# Patient Record
Sex: Male | Born: 1970 | Race: White | Hispanic: No | Marital: Married
Health system: Midwestern US, Community
[De-identification: ages and names within clinical notes are randomized; demographics above are authoritative.]

## PROBLEM LIST (undated history)

## (undated) DIAGNOSIS — E785 Hyperlipidemia, unspecified: Secondary | ICD-10-CM

## (undated) DIAGNOSIS — J020 Streptococcal pharyngitis: Secondary | ICD-10-CM

## (undated) DIAGNOSIS — Z9289 Personal history of other medical treatment: Secondary | ICD-10-CM

## (undated) DIAGNOSIS — K219 Gastro-esophageal reflux disease without esophagitis: Secondary | ICD-10-CM

## (undated) DIAGNOSIS — J040 Acute laryngitis: Secondary | ICD-10-CM

## (undated) DIAGNOSIS — J039 Acute tonsillitis, unspecified: Secondary | ICD-10-CM

## (undated) DIAGNOSIS — I2510 Atherosclerotic heart disease of native coronary artery without angina pectoris: Secondary | ICD-10-CM

## (undated) DIAGNOSIS — I214 Non-ST elevation (NSTEMI) myocardial infarction: Secondary | ICD-10-CM

## (undated) DIAGNOSIS — I10 Essential (primary) hypertension: Secondary | ICD-10-CM

## (undated) HISTORY — DX: Non-ST elevation (NSTEMI) myocardial infarction: I21.4

## (undated) HISTORY — DX: Atherosclerotic heart disease of native coronary artery without angina pectoris: I25.10

## (undated) HISTORY — DX: Gastro-esophageal reflux disease without esophagitis: K21.9

## (undated) HISTORY — DX: Personal history of other medical treatment: Z92.89

## (undated) HISTORY — DX: Hyperlipidemia, unspecified: E78.5

## (undated) HISTORY — PX: CORONARY ANGIOPLASTY WITH STENT PLACEMENT: SHX49

## (undated) HISTORY — PX: WISDOM TOOTH EXTRACTION: SHX21

---

## 2004-02-04 ENCOUNTER — Encounter: Admission: RE | Admit: 2004-02-04 | Discharge: 2004-02-04 | Payer: Self-pay | Admitting: Orthopedic Surgery

## 2004-02-25 ENCOUNTER — Encounter: Admission: RE | Admit: 2004-02-25 | Discharge: 2004-02-25 | Payer: Self-pay | Admitting: Orthopedic Surgery

## 2007-03-24 ENCOUNTER — Emergency Department (HOSPITAL_COMMUNITY): Admission: EM | Admit: 2007-03-24 | Discharge: 2007-03-24 | Payer: Self-pay | Admitting: Emergency Medicine

## 2008-01-06 ENCOUNTER — Ambulatory Visit (HOSPITAL_COMMUNITY): Admission: RE | Admit: 2008-01-06 | Discharge: 2008-01-06 | Payer: Self-pay | Admitting: Family Medicine

## 2008-02-03 ENCOUNTER — Ambulatory Visit (HOSPITAL_COMMUNITY): Admission: RE | Admit: 2008-02-03 | Discharge: 2008-02-03 | Payer: Self-pay | Admitting: General Surgery

## 2008-10-15 ENCOUNTER — Ambulatory Visit (HOSPITAL_COMMUNITY): Admission: RE | Admit: 2008-10-15 | Discharge: 2008-10-15 | Payer: Self-pay | Admitting: Internal Medicine

## 2009-02-17 ENCOUNTER — Ambulatory Visit (HOSPITAL_COMMUNITY): Admission: RE | Admit: 2009-02-17 | Discharge: 2009-02-17 | Payer: Self-pay | Admitting: Family Medicine

## 2009-03-19 ENCOUNTER — Emergency Department (HOSPITAL_COMMUNITY): Admission: EM | Admit: 2009-03-19 | Discharge: 2009-03-19 | Payer: Self-pay | Admitting: Emergency Medicine

## 2010-07-30 ENCOUNTER — Encounter: Payer: Self-pay | Admitting: Family Medicine

## 2010-11-21 NOTE — H&P (Signed)
Rodney Baldwin, ANTRIM                 ACCOUNT NO.:  [id number]   MEDICAL RECORD NO.:  [id number]          PATIENT TYPE:  AMB   LOCATION:  DAY                           FACILITY:  [facility]   PHYSICIAN:  Dalia Heading, M.D.  DATE OF BIRTH:  [birth year]   DATE OF ADMISSION:  DATE OF DISCHARGE:  LH                              HISTORY & PHYSICAL   CHIEF COMPLAINT:  GERD.   HISTORY OF PRESENT ILLNESS:  The patient is a 40 year old white male who  is referred for an endoscopic evaluation.  He needs an EGD for  gastroesophageal reflux disease.  He has been having GERD and  intermittent dysphagia for many years.  Nexium has been only mildly  helpful.  It is worse with lying down.  No abdominal pain, weight loss,  nausea, diarrhea, constipation, melena, hematochezia are noted.  The  patient does like carbonated drinks and tobacco.   PAST MEDICAL HISTORY:  As noted above.   PAST SURGICAL HISTORY:  Wisdom teeth extraction.   CURRENT MEDICATIONS:  Nexium 1 tablet p.o. b.i.d.   ALLERGIES:  No known drug allergies.   REVIEW OF SYSTEMS:  The patient does smoke tobacco.  He denies any  significant alcohol use.   PHYSICAL EXAMINATION:  GENERAL:  The patient is a well developed, well  nourished white male in no acute distress.  LUNGS:  Clear to auscultation with equal breath sounds bilaterally.  HEART:  Regular rate and rhythm without S3, S4, or murmurs.  ABDOMEN:  Soft, nontender, and nondistended.  No hepatosplenomegaly or  masses are noted.  RECTAL:  Deferred to the procedure.   IMPRESSION:  Gastroesophageal reflux disease and dysphagia.   PLAN:  The patient is scheduled for an EGD on February 03, 2008.  The risks  and benefits of the procedure including bleeding and perforation were  fully explained to the patient, gave informed consent.     Dalia Heading, M.D.  Electronically Signed    MAJ/MEDQ  D:  01/27/2008  T:  01/28/2008  Job:  [id number]

## 2011-02-09 ENCOUNTER — Emergency Department (HOSPITAL_COMMUNITY)
Admission: EM | Admit: 2011-02-09 | Discharge: 2011-02-09 | Disposition: A | Discharge status: Home / Self Care | Payer: PRIVATE HEALTH INSURANCE | Attending: Emergency Medicine | Admitting: Emergency Medicine

## 2011-02-09 ENCOUNTER — Encounter: Payer: Self-pay | Admitting: *Deleted

## 2011-02-09 DIAGNOSIS — I10 Essential (primary) hypertension: Secondary | ICD-10-CM | POA: Insufficient documentation

## 2011-02-09 DIAGNOSIS — K219 Gastro-esophageal reflux disease without esophagitis: Secondary | ICD-10-CM | POA: Insufficient documentation

## 2011-02-09 DIAGNOSIS — N419 Inflammatory disease of prostate, unspecified: Secondary | ICD-10-CM | POA: Insufficient documentation

## 2011-02-09 HISTORY — DX: Essential (primary) hypertension: I10

## 2011-02-09 LAB — URINALYSIS, ROUTINE W REFLEX MICROSCOPIC
Bilirubin Urine: NEGATIVE
Ketones, ur: NEGATIVE mg/dL
Protein, ur: NEGATIVE mg/dL
Urobilinogen, UA: 0.2 mg/dL (ref 0.0–1.0)
pH: 5.5 (ref 5.0–8.0)

## 2011-02-09 LAB — URINE MICROSCOPIC-ADD ON

## 2011-02-09 MED ORDER — DOXYCYCLINE HYCLATE 100 MG PO CAPS: 100.0000 mg | ORAL_CAPSULE | Freq: Two times a day (BID) | ORAL | Status: DC

## 2011-02-09 MED ORDER — CEFTRIAXONE SODIUM 250 MG IJ SOLR
250.0000 mg | Freq: Once | INTRAMUSCULAR | Status: AC
  Administered 2011-02-09: 250 mg via INTRAMUSCULAR
  Filled 2011-02-09: qty 250

## 2011-02-09 NOTE — ED Notes (Signed)
Urinary frequency x 1 wk.  Denies fever/n/v/abd pain.  Denies painful urination.

## 2011-02-09 NOTE — ED Provider Notes (Signed)
History   Chart scribed for Flint Melter, MD by Enos Fling; the patient was seen in room APFT20/APFT20; this patient's care was started at 12:53 PM.    CSN: [id number] Arrival date & time: 02/09/2011 11:33 AM  Chief Complaint  Patient presents with  . Urinary Frequency   HPI Rodney Baldwin is a 40 y.o. male who presents to the Emergency Department complaining of urinary frequency. Pt reports 4-5 days of increasing urinary frequency and dribbling, worse at night, reports has to urinate approx every 30 minutes. No dysuria, polydipsia, n/v/d, f/c, abd pain, or flank pain. Denies any pain at all. Reports similar episode 1x previously that resolved on its own with drinking more fluids. Pt is sexually active with 1 partner (wife).  PCP Dr. Phillips Odor   PAST MEDICAL HISTORY:  Past Medical History  Diagnosis Date  . Hypertension   . Acid reflux     PAST SURGICAL HISTORY:  History reviewed. No pertinent past surgical history.  MEDICATIONS:  Previous Medications   ESOMEPRAZOLE MAGNESIUM (NEXIUM PO)    Take by mouth.     VARENICLINE TARTRATE (CHANTIX PO)    Take by mouth.       ALLERGIES:  Allergies as of 02/09/2011  . (No Known Allergies)     FAMILY HISTORY: ** Reports strong Fhx DM and HTN. No family history on file.   SOCIAL HISTORY: History   Social History  . Marital Status: Married    Spouse Name: N/A    Number of Children: N/A  . Years of Education: N/A   Social History Main Topics  . Smoking status: Former Games developer  . Smokeless tobacco: None  . Alcohol Use: No  . Drug Use: No  . Sexually Active:    Other Topics Concern  . None   Social History Narrative  . None      Review of Systems  Constitutional: Negative for fever and chills.  Respiratory: Negative for cough.   Gastrointestinal: Negative for nausea, vomiting, abdominal pain and diarrhea.  Genitourinary: Positive for frequency. Negative for dysuria, flank pain, discharge, penile swelling, scrotal  swelling, penile pain and testicular pain.  Musculoskeletal: Negative for back pain.   Nursing notes and vital signs reviewed.  Physical Exam  BP 151/88  Pulse 64  Temp(Src) 97.5 F (36.4 C) (Oral)  Resp 20  Ht 6' (1.829 m)  Wt 204 lb (92.534 kg)  BMI 27.67 kg/m2  SpO2 99%  Physical Exam  Constitutional: He is oriented to person, place, and time. He appears well-developed and well-nourished.  HENT:  Head: Normocephalic and atraumatic.  Right Ear: External ear normal.  Left Ear: External ear normal.  Eyes: Conjunctivae and EOM are normal. Pupils are equal, round, and reactive to light.  Neck: Normal range of motion and phonation normal. Neck supple.  Cardiovascular: Normal rate, regular rhythm, normal heart sounds and intact distal pulses.   Pulmonary/Chest: Effort normal and breath sounds normal. He exhibits no bony tenderness.  Abdominal: Soft. Normal appearance. There is no tenderness. There is no CVA tenderness.  Genitourinary: Rectum normal and penis normal. No penile tenderness.       Prostate is mildly tender and swollen.  Musculoskeletal: Normal range of motion.       No back tenderness  Neurological: He is alert and oriented to person, place, and time. He has normal strength. No cranial nerve deficit or sensory deficit. He exhibits normal muscle tone. Coordination normal.  Skin: Skin is warm, dry and intact.  Psychiatric:  He has a normal mood and affect. His behavior is normal. Judgment and thought content normal.    ED Course  Procedures  OTHER DATA REVIEWED: Nursing notes and vital signs reviewed.   LABS / RADIOLOGY:  Results for orders placed during the hospital encounter of 02/09/11  URINALYSIS, ROUTINE W REFLEX MICROSCOPIC      Component Value Range   Color, Urine YELLOW  YELLOW    Appearance CLEAR  CLEAR    Specific Gravity, Urine 1.005  1.005 - 1.030    pH 5.5  5.0 - 8.0    Glucose, UA NEGATIVE  NEGATIVE (mg/dL)   Hgb urine dipstick SMALL (*)  NEGATIVE    Bilirubin Urine NEGATIVE  NEGATIVE    Ketones, ur NEGATIVE  NEGATIVE (mg/dL)   Protein, ur NEGATIVE  NEGATIVE (mg/dL)   Urobilinogen, UA 0.2  0.0 - 1.0 (mg/dL)   Nitrite NEGATIVE  NEGATIVE    Leukocytes, UA NEGATIVE  NEGATIVE   URINE MICROSCOPIC-ADD ON      Component Value Range   RBC / HPF 3-6  <3 (RBC/hpf)   Bacteria, UA RARE  RARE      MDM: Prostatitis; rocephin in ED; d/c with doxycycline   IMPRESSION Prostatitis      PLAN:  discharge The patient is to return the emergency department if there is any worsening of symptoms. I have reviewed the discharge instructions with the patient  CONDITION ON DISCHARGE: good   MEDICATIONS GIVEN IN THE E.D. cefTRIAXone (ROCEPHIN) injection 250 mg (250 mg Intramuscular Given 02/09/11 1259)     DISCHARGE MEDICATIONS: New Prescriptions   DOXYCYCLINE (VIBRAMYCIN) 100 MG CAPSULE    Take 1 capsule (100 mg total) by mouth 2 (two) times daily.     I personally performed the services described in this documentation, which was scribed in my presence. The recorded information has been reviewed and considered. Flint Melter, MD     Flint Melter, MD 02/09/11 [phone]

## 2011-02-09 NOTE — ED Notes (Signed)
Pt denies reaction from ABX shot. Pt a/ox4. Resp even and unlabored. NAD at this time. D/C instructions reviewed with pt. Pt verblaized understanding of instructions and RX. Pt escorted to d/c desk. Pt ambulated with steady gate.

## 2011-02-11 ENCOUNTER — Emergency Department (HOSPITAL_COMMUNITY): Payer: PRIVATE HEALTH INSURANCE

## 2011-02-11 ENCOUNTER — Encounter (HOSPITAL_COMMUNITY): Payer: Self-pay | Admitting: Emergency Medicine

## 2011-02-11 ENCOUNTER — Emergency Department (HOSPITAL_COMMUNITY)
Admission: EM | Admit: 2011-02-11 | Discharge: 2011-02-11 | Disposition: A | Discharge status: Home / Self Care | Payer: PRIVATE HEALTH INSURANCE | Attending: Emergency Medicine | Admitting: Emergency Medicine

## 2011-02-11 DIAGNOSIS — N200 Calculus of kidney: Secondary | ICD-10-CM | POA: Insufficient documentation

## 2011-02-11 DIAGNOSIS — Z87891 Personal history of nicotine dependence: Secondary | ICD-10-CM | POA: Insufficient documentation

## 2011-02-11 DIAGNOSIS — I10 Essential (primary) hypertension: Secondary | ICD-10-CM | POA: Insufficient documentation

## 2011-02-11 DIAGNOSIS — K219 Gastro-esophageal reflux disease without esophagitis: Secondary | ICD-10-CM | POA: Insufficient documentation

## 2011-02-11 LAB — CBC
HCT: 41.1 % (ref 39.0–52.0)
Hemoglobin: 14.4 g/dL (ref 13.0–17.0)
MCH: 30 pg (ref 26.0–34.0)
MCHC: 35 g/dL (ref 30.0–36.0)
MCV: 85.6 fL (ref 78.0–100.0)
Platelets: 190 K/uL (ref 150–400)
RBC: 4.8 MIL/uL (ref 4.22–5.81)
RDW: 13.1 % (ref 11.5–15.5)
WBC: 12.4 K/uL — ABNORMAL HIGH (ref 4.0–10.5)

## 2011-02-11 LAB — URINALYSIS, ROUTINE W REFLEX MICROSCOPIC
Bilirubin Urine: NEGATIVE
Glucose, UA: NEGATIVE mg/dL
Ketones, ur: NEGATIVE mg/dL
Leukocytes, UA: NEGATIVE
Nitrite: NEGATIVE
Protein, ur: 30 mg/dL — AB
Specific Gravity, Urine: >1.03 — ABNORMAL HIGH (ref 1.005–1.030)
Urobilinogen, UA: 0.2 mg/dL (ref 0.0–1.0)
pH: 5.5 (ref 5.0–8.0)

## 2011-02-11 LAB — COMPREHENSIVE METABOLIC PANEL
AST: 19 U/L (ref 0–37)
Albumin: 4.6 g/dL (ref 3.5–5.2)
Alkaline Phosphatase: 92 U/L (ref 39–117)
BUN: 19 mg/dL (ref 6–23)
CO2: 21 mEq/L (ref 19–32)
Calcium: 9.8 mg/dL (ref 8.4–10.5)
Chloride: 101 mEq/L (ref 96–112)
GFR calc Af Amer: >60 mL/min (ref >60)
Sodium: 136 mEq/L (ref 135–145)
Total Bilirubin: 0.4 mg/dL (ref 0.3–1.2)
Total Protein: 8.4 g/dL — ABNORMAL HIGH (ref 6.0–8.3)

## 2011-02-11 LAB — DIFFERENTIAL
Basophils Absolute: 0 K/uL (ref 0.0–0.1)
Basophils Relative: 0 % (ref 0–1)
Eosinophils Absolute: 0.2 K/uL (ref 0.0–0.7)
Eosinophils Relative: 2 % (ref 0–5)
Lymphocytes Relative: 21 % (ref 12–46)
Lymphs Abs: 2.6 K/uL (ref 0.7–4.0)
Monocytes Absolute: 0.8 K/uL (ref 0.1–1.0)
Monocytes Relative: 7 % (ref 3–12)
Neutro Abs: 8.8 K/uL — ABNORMAL HIGH (ref 1.7–7.7)
Neutrophils Relative %: 71 % (ref 43–77)

## 2011-02-11 LAB — LIPASE, BLOOD: Lipase: 35 U/L (ref 11–59)

## 2011-02-11 LAB — URINE MICROSCOPIC-ADD ON

## 2011-02-11 MED ORDER — ONDANSETRON HCL 4 MG/2ML IJ SOLN
4.0000 mg | Freq: Once | INTRAMUSCULAR | Status: AC
  Administered 2011-02-11: 4 mg via INTRAVENOUS
  Filled 2011-02-11: qty 2

## 2011-02-11 MED ORDER — IOHEXOL 300 MG/ML  SOLN
100.0000 mL | Freq: Once | INTRAMUSCULAR | Status: AC | PRN
  Administered 2011-02-11: 100 mL via INTRAVENOUS

## 2011-02-11 MED ORDER — IBUPROFEN 800 MG PO TABS: 800.0000 mg | ORAL_TABLET | Freq: Three times a day (TID) | ORAL | Status: AC

## 2011-02-11 MED ORDER — HYDROMORPHONE HCL 1 MG/ML IJ SOLN
INTRAMUSCULAR | Status: AC
  Filled 2011-02-11: qty 1

## 2011-02-11 MED ORDER — TAMSULOSIN HCL 0.4 MG PO CAPS: 0.4000 mg | ORAL_CAPSULE | Freq: Every day | ORAL | Status: DC

## 2011-02-11 MED ORDER — HYDROMORPHONE HCL 1 MG/ML IJ SOLN
1.0000 mg | Freq: Once | INTRAMUSCULAR | Status: AC
Start: 2011-02-11 — End: 2011-02-11
  Administered 2011-02-11: 1 mg via INTRAVENOUS
  Filled 2011-02-11: qty 1

## 2011-02-11 MED ORDER — HYDROMORPHONE HCL 1 MG/ML IJ SOLN
1.0000 mg | Freq: Once | INTRAMUSCULAR | Status: AC
  Administered 2011-02-11: 1 mg via INTRAVENOUS

## 2011-02-11 MED ORDER — OXYCODONE-ACETAMINOPHEN 5-325 MG PO TABS
1.0000 | ORAL_TABLET | Freq: Four times a day (QID) | ORAL | Status: AC | PRN
Start: 2011-02-11 — End: 2011-02-21

## 2011-02-11 NOTE — ED Notes (Signed)
Pt c/o continued lower abd and groin pain with new onset n/v. Pt states he was seen in ed on Friday and dx with prostatitis.

## 2011-02-11 NOTE — ED Provider Notes (Signed)
Scribed for [employer]. Bernette Mayers, MD, the patient was seen in room 15. This chart was scribed by Jannette Fogo. This patient's care was started at 08:26.    CSN: [id number] Arrival date & time: 02/11/2011  8:04 AM  Chief Complaint  Patient presents with  . Abdominal Pain   HPI Rodney Baldwin is a 40 y.o. male who presents to the Emergency Department complaining of right lower quadrant abdominal pain with associated nausea and vomiting. Patient was recently seen in the ED on 02/09/11 by Dr. Effie Shy for urinary frequency and diagnosed with prostatitis. He was given Rocephin in the ED and discharged home with Doxycycline. Patient returns to the ED complaining of new onset of right lower quadrant abdominal pain which started this AM. He reports associated vomiting, difficulty urinating with dysuria and frequency, and frequent bowel movements. He denies any fever or hematochezia. Denies a history of appendectomy. There are no other associated symptoms and no other alleviating or aggravating factors.     Past Medical History  Diagnosis Date  . Hypertension   . Acid reflux     MEDICATIONS:  Previous Medications   DOXYCYCLINE (VIBRAMYCIN) 100 MG CAPSULE    Take 1 capsule (100 mg total) by mouth 2 (two) times daily.   ESOMEPRAZOLE (NEXIUM) 40 MG CAPSULE    Take 40 mg by mouth 2 (two) times daily.     ESOMEPRAZOLE MAGNESIUM (NEXIUM PO)    Take 40 mg by mouth 2 (two) times daily.    IBUPROFEN (ADVIL,MOTRIN) 200 MG TABLET    Take 800 mg by mouth every 8 (eight) hours as needed. OTC FOR PAIN OR headache   VARENICLINE (CHANTIX) 1 MG TABLET    Take 1 mg by mouth 2 (two) times daily.     VARENICLINE TARTRATE (CHANTIX PO)    Take 1 mg by mouth 2 (two) times daily. Patient is on his  Last month     ALLERGIES:  Allergies as of 02/11/2011  . (No Known Allergies)      History  Substance Use Topics  . Smoking status: Former Games developer  . Smokeless tobacco: Not on file  . Alcohol Use: No  Married  Accompanied  to the ED by wife Quit smoking tobacco    Review of Systems  Constitutional: Negative.  Negative for fever.  HENT: Negative.   Respiratory: Negative.   Cardiovascular: Negative.   Gastrointestinal: Positive for nausea, vomiting and abdominal pain. Negative for constipation and blood in stool.  Genitourinary: Positive for dysuria, urgency, frequency and difficulty urinating.  Musculoskeletal: Negative.   Skin: Negative.   Neurological: Negative.   Hematological: Negative.   All other systems reviewed and are negative.    Physical Exam  BP 123/88  Pulse 60  Temp(Src) 97.7 F (36.5 C) (Oral)  Resp 20  Ht 6' (1.829 m)  Wt 204 lb (92.534 kg)  BMI 27.67 kg/m2  SpO2 96%  Physical Exam  Nursing note and vitals reviewed. Constitutional: He is oriented to person, place, and time. He appears well-developed and well-nourished.  HENT:  Head: Normocephalic and atraumatic.  Eyes: EOM are normal. Pupils are equal, round, and reactive to light.  Neck: Normal range of motion. Neck supple.  Cardiovascular: Normal rate, normal heart sounds and intact distal pulses.   Pulmonary/Chest: Effort normal and breath sounds normal.  Abdominal: Bowel sounds are normal. He exhibits no distension. There is tenderness (RLQ). There is guarding (RLQ).  Musculoskeletal: Normal range of motion. He exhibits no edema and no tenderness.  Neurological: He is alert and oriented to person, place, and time. No cranial nerve deficit.  Skin: Skin is warm and dry. No rash noted.  Psychiatric: He has a normal mood and affect.    OTHER DATA REVIEWED: Nursing notes, vital signs, and past medical records reviewed.   DIAGNOSTIC STUDIES: Oxygen Saturation is 96% on room air, normal by my interpretation.     LABS / RADIOLOGY:  Results for orders placed during the hospital encounter of 02/11/11  CBC      Component Value Range   WBC 12.4 (*) 4.0 - 10.5 (K/uL)   RBC 4.80  4.22 - 5.81 (MIL/uL)   Hemoglobin 14.4   13.0 - 17.0 (g/dL)   HCT 16.1  09.6 - 04.5 (%)   MCV 85.6  78.0 - 100.0 (fL)   MCH 30.0  26.0 - 34.0 (pg)   MCHC 35.0  30.0 - 36.0 (g/dL)   RDW 40.9  81.1 - 91.4 (%)   Platelets 190  150 - 400 (K/uL)  DIFFERENTIAL      Component Value Range   Neutrophils Relative 71  43 - 77 (%)   Neutro Abs 8.8 (*) 1.7 - 7.7 (K/uL)   Lymphocytes Relative 21  12 - 46 (%)   Lymphs Abs 2.6  0.7 - 4.0 (K/uL)   Monocytes Relative 7  3 - 12 (%)   Monocytes Absolute 0.8  0.1 - 1.0 (K/uL)   Eosinophils Relative 2  0 - 5 (%)   Eosinophils Absolute 0.2  0.0 - 0.7 (K/uL)   Basophils Relative 0  0 - 1 (%)   Basophils Absolute 0.0  0.0 - 0.1 (K/uL)  COMPREHENSIVE METABOLIC PANEL      Component Value Range   Sodium 136  135 - 145 (mEq/L)   Potassium 3.8  3.5 - 5.1 (mEq/L)   Chloride 101  96 - 112 (mEq/L)   CO2 21  19 - 32 (mEq/L)   Glucose, Bld 127 (*) 70 - 99 (mg/dL)   BUN 19  6 - 23 (mg/dL)   Creatinine, Ser 7.82  0.50 - 1.35 (mg/dL)   Calcium 9.8  8.4 - 95.6 (mg/dL)   Total Protein 8.4 (*) 6.0 - 8.3 (g/dL)   Albumin 4.6  3.5 - 5.2 (g/dL)   AST 19  0 - 37 (U/L)   ALT 26  0 - 53 (U/L)   Alkaline Phosphatase 92  39 - 117 (U/L)   Total Bilirubin 0.4  0.3 - 1.2 (mg/dL)   GFR calc non Af Amer >60  >60 (mL/min)   GFR calc Af Amer >60  >60 (mL/min)  LIPASE, BLOOD      Component Value Range   Lipase 35  11 - 59 (U/L)  URINALYSIS, ROUTINE W REFLEX MICROSCOPIC      Component Value Range   Color, Urine YELLOW  YELLOW    Appearance HAZY (*) CLEAR    Specific Gravity, Urine >1.030 (*) 1.005 - 1.030    pH 5.5  5.0 - 8.0    Glucose, UA NEGATIVE  NEGATIVE (mg/dL)   Hgb urine dipstick LARGE (*) NEGATIVE    Bilirubin Urine NEGATIVE  NEGATIVE    Ketones, ur NEGATIVE  NEGATIVE (mg/dL)   Protein, ur 30 (*) NEGATIVE (mg/dL)   Urobilinogen, UA 0.2  0.0 - 1.0 (mg/dL)   Nitrite NEGATIVE  NEGATIVE    Leukocytes, UA NEGATIVE  NEGATIVE   URINE MICROSCOPIC-ADD ON      Component Value Range   Squamous Epithelial /  LPF  RARE  RARE    WBC, UA 0-2  <3 (WBC/hpf)   RBC / HPF 11-20  <3 (RBC/hpf)   Bacteria, UA FEW (*) RARE    Crystals CA OXALATE CRYSTALS (*) NEGATIVE    Urine-Other MUCOUS PRESENT        CT Abdomen / Pelvis: Interpreted by Radiologist, reviewed by me  No appendicitis, large distal R ureteral stone    ED COURSE / COORDINATION OF CARE: Labs unremarkable except for hematuria. CT as above. Pt feeling better, ready to go home. D/C with pain meds, flomax and urology referral if needed.     IMPRESSION: Diagnoses that have been ruled out:  Diagnoses that are still under consideration:  Final diagnoses:    PLAN:   The patient is to return the emergency department if there is any worsening of symptoms. I have reviewed the discharge instructions with the patient.    CONDITION ON DISCHARGE: Good   MEDICATIONS GIVEN IN THE E.D.  Medications  varenicline (CHANTIX) 1 MG tablet (not administered)  HYDROmorphone (DILAUDID) injection 1 mg (1 mg Intravenous Given 02/11/11 0903)  ondansetron (ZOFRAN) injection 4 mg (4 mg Intravenous Given 02/11/11 0845)     DISCHARGE MEDICATIONS: New Prescriptions   No medications on file       Procedures  I personally performed the services described in this documentation, which was scribed in my presence. The recorded information has been reviewed and considered. ORLOVIC, MATEA       Roann Merk B. Bernette Mayers, MD 02/11/11 1133

## 2011-02-13 LAB — URINE CULTURE: Culture  Setup Time: 201208051950

## 2011-02-15 ENCOUNTER — Ambulatory Visit: Payer: Self-pay | Admitting: Urology

## 2011-03-06 ENCOUNTER — Ambulatory Visit: Payer: PRIVATE HEALTH INSURANCE | Admitting: Urology

## 2011-03-15 ENCOUNTER — Ambulatory Visit: Payer: Self-pay | Admitting: Urology

## 2011-03-22 ENCOUNTER — Ambulatory Visit: Payer: Self-pay | Admitting: Urology

## 2011-04-06 ENCOUNTER — Ambulatory Visit: Payer: Self-pay | Admitting: Urology

## 2011-04-06 LAB — CLOTEST (H. PYLORI), BIOPSY: Helicobacter screen: NEGATIVE

## 2011-04-19 LAB — DIFFERENTIAL
Eosinophils Absolute: 0.3
Lymphs Abs: 3.1
Monocytes Absolute: 0.6
Monocytes Relative: 4
Neutro Abs: 9.8 — ABNORMAL HIGH
Neutrophils Relative %: 71

## 2011-04-19 LAB — COMPREHENSIVE METABOLIC PANEL
ALT: 32
Albumin: 4.1
Calcium: 9
Glucose, Bld: 109 — ABNORMAL HIGH
Potassium: 3.7
Sodium: 135
Total Protein: 7.6

## 2011-04-19 LAB — CBC
Hemoglobin: 15
MCHC: 34.6
Platelets: 205
RDW: 12.8

## 2011-04-19 LAB — CARBOXYHEMOGLOBIN
Carboxyhemoglobin: 1.7 — ABNORMAL HIGH
O2 Saturation: 99.4

## 2012-04-08 ENCOUNTER — Emergency Department (HOSPITAL_COMMUNITY)
Admission: EM | Admit: 2012-04-08 | Discharge: 2012-04-09 | Disposition: A | Discharge status: Home / Self Care | Payer: PRIVATE HEALTH INSURANCE | Attending: Emergency Medicine | Admitting: Emergency Medicine

## 2012-04-08 ENCOUNTER — Encounter (HOSPITAL_COMMUNITY): Payer: Self-pay | Admitting: *Deleted

## 2012-04-08 DIAGNOSIS — R599 Enlarged lymph nodes, unspecified: Secondary | ICD-10-CM | POA: Insufficient documentation

## 2012-04-08 DIAGNOSIS — F17200 Nicotine dependence, unspecified, uncomplicated: Secondary | ICD-10-CM | POA: Insufficient documentation

## 2012-04-08 DIAGNOSIS — K219 Gastro-esophageal reflux disease without esophagitis: Secondary | ICD-10-CM | POA: Insufficient documentation

## 2012-04-08 DIAGNOSIS — I10 Essential (primary) hypertension: Secondary | ICD-10-CM | POA: Insufficient documentation

## 2012-04-08 DIAGNOSIS — R591 Generalized enlarged lymph nodes: Secondary | ICD-10-CM

## 2012-04-08 MED ORDER — OXYCODONE-ACETAMINOPHEN 5-325 MG PO TABS
2.0000 | ORAL_TABLET | Freq: Once | ORAL | Status: AC
  Administered 2012-04-09: 2 via ORAL
  Filled 2012-04-08: qty 2

## 2012-04-08 NOTE — ED Notes (Signed)
Pt reporting painful lump in groin area.  Reports felt lump last week, pain started on Sunday.  Denies difficulty with urination.

## 2012-04-08 NOTE — ED Notes (Signed)
Patient complaining of "lump in groin" x 1 week with pain starting this weekend. States pain is constant but worse with movement. Denies difficulty urinating or burning with urination.

## 2012-04-08 NOTE — ED Provider Notes (Signed)
History   This chart was scribed for Lyanne Co, MD by Gerlean Ren. This patient was seen in room APA10/APA10 and the patient's care was started at 11:31PM.   CSN: [id number]  Arrival date & time 04/08/12  2211   First MD Initiated Contact with Patient 04/08/12 2306      Chief Complaint  Patient presents with  . Groin Pain    Patient is a 41 y.o. male presenting with groin pain. The history is provided by the patient. No language interpreter was used.  Groin Pain   Rodney Baldwin is a 41 y.o. male who presents to the Emergency Department complaining of a mass in right groin area first noticed last week with associated gradual onset, throbbing  pain beginning 2 days ago.  Pt able to ambulate with minimal discomfort.  Pt has used ibuprofen with no improvements.  Pt has h/o HTN.  Pt is a current everyday smoker (0.5pack/day) but denies alcohol use.  He denies penile pain or scrotal pain.  His had no fevers or chills.  He denies nausea vomiting.  He's been moving his bowels normally.  He has no dysuria or urinary frequency.  He denies testicular pain.  His had no overlying skin changes.  Past Medical History  Diagnosis Date  . Hypertension   . Acid reflux     History reviewed. No pertinent past surgical history.  History reviewed. No pertinent family history.  History  Substance Use Topics  . Smoking status: Current Every Day Smoker -- 0.5 packs/day  . Smokeless tobacco: Not on file  . Alcohol Use: No      Review of Systems A complete 10 system review of systems was obtained and all systems are negative except as noted in the HPI and PMH.   Allergies  Review of patient's allergies indicates no known allergies.  Home Medications   Current Outpatient Rx  Name Route Sig Dispense Refill  . ESOMEPRAZOLE MAGNESIUM 40 MG PO CPDR Oral Take 40 mg by mouth 2 (two) times daily.      Marland Kitchen NEXIUM PO Oral Take 40 mg by mouth 2 (two) times daily.     . IBUPROFEN 200 MG PO TABS Oral Take  800 mg by mouth every 8 (eight) hours as needed. OTC FOR PAIN OR headache    . TAMSULOSIN HCL 0.4 MG PO CAPS Oral Take 1 capsule (0.4 mg total) by mouth daily. 14 capsule 0  . VARENICLINE TARTRATE 1 MG PO TABS Oral Take 1 mg by mouth 2 (two) times daily.      . CHANTIX PO Oral Take 1 mg by mouth 2 (two) times daily. Patient is on his  Last month      BP 145/85  Pulse 94  Temp 98.5 F (36.9 C) (Oral)  Resp 20  Ht 6' (1.829 m)  Wt 214 lb (97.07 kg)  BMI 29.02 kg/m2  SpO2 99%  Physical Exam  Nursing note and vitals reviewed. Constitutional: He is oriented to person, place, and time. He appears well-developed.  HENT:  Head: Normocephalic and atraumatic.  Eyes: EOM are normal.  Neck: No tracheal deviation present.  Genitourinary: Penis normal. No penile tenderness.       Small palpable and tender mass that is freely mobiel in right mid-inguinal area.  No overlying skin changes or erythema.   Circumcised penis, normal in appearance and non-tender.   Normal right inguinal canal.  No palpable hernia.    Musculoskeletal: Normal range of motion. He  exhibits no edema.  Neurological: He is alert and oriented to person, place, and time.  Skin: Skin is warm. No rash noted.  Psychiatric: He has a normal mood and affect.    ED Course  Procedures (including critical care time) DIAGNOSTIC STUDIES: Oxygen Saturation is 99% on room air, normal by my interpretation.    COORDINATION OF CARE: 11:36PM-  Ordered percocet.  Informed pt that immediate care is not necessary. Advised further at-home observation and follow-up with PCP if symptoms worsen.     Labs Reviewed - No data to display No results found.   1. Lymphadenopathy       MDM  This is well appearing.  I suspect this is lymphadenopathy in his right groin.  No signs of hernia at this time.  The patient we discharged home to follow up with his primary care physician.  No external signs of infection.  No recent trauma.  Abdomen is  benign.  The patient understands the importance of close PCP followup and continued vigilant monitoring of his right groin  I personally performed the services described in this documentation, which was scribed in my presence. The recorded information has been reviewed and considered.          Lyanne Co, MD 04/09/12 0010

## 2012-04-09 MED ORDER — OXYCODONE-ACETAMINOPHEN 5-325 MG PO TABS: 1.0000 | ORAL_TABLET | ORAL | Status: DC | PRN

## 2013-02-28 ENCOUNTER — Emergency Department (HOSPITAL_COMMUNITY)
Admission: EM | Admit: 2013-02-28 | Discharge: 2013-03-01 | Disposition: A | Discharge status: Home / Self Care | Payer: BC Managed Care – PPO | Attending: Emergency Medicine | Admitting: Emergency Medicine

## 2013-02-28 DIAGNOSIS — I10 Essential (primary) hypertension: Secondary | ICD-10-CM | POA: Insufficient documentation

## 2013-02-28 DIAGNOSIS — F17200 Nicotine dependence, unspecified, uncomplicated: Secondary | ICD-10-CM | POA: Insufficient documentation

## 2013-02-28 DIAGNOSIS — K219 Gastro-esophageal reflux disease without esophagitis: Secondary | ICD-10-CM | POA: Insufficient documentation

## 2013-02-28 DIAGNOSIS — K1379 Other lesions of oral mucosa: Secondary | ICD-10-CM

## 2013-02-28 DIAGNOSIS — Z79899 Other long term (current) drug therapy: Secondary | ICD-10-CM | POA: Insufficient documentation

## 2013-02-28 DIAGNOSIS — Z8619 Personal history of other infectious and parasitic diseases: Secondary | ICD-10-CM | POA: Insufficient documentation

## 2013-02-28 DIAGNOSIS — K1370 Unspecified lesions of oral mucosa: Secondary | ICD-10-CM | POA: Insufficient documentation

## 2013-02-28 DIAGNOSIS — R1110 Vomiting, unspecified: Secondary | ICD-10-CM | POA: Insufficient documentation

## 2013-02-28 DIAGNOSIS — Z8709 Personal history of other diseases of the respiratory system: Secondary | ICD-10-CM | POA: Insufficient documentation

## 2013-02-28 HISTORY — DX: Streptococcal pharyngitis: J02.0

## 2013-02-28 HISTORY — DX: Acute laryngitis: J04.0

## 2013-02-28 HISTORY — DX: Acute tonsillitis, unspecified: J03.90

## 2013-02-28 NOTE — ED Notes (Signed)
Patient states that his wife woke him up to go to bed and that his throat was hurting. States that he was walking down the hall in the house and it felt like his throat was closing.

## 2013-03-01 ENCOUNTER — Encounter (HOSPITAL_COMMUNITY): Payer: Self-pay

## 2013-03-01 ENCOUNTER — Emergency Department (HOSPITAL_COMMUNITY): Payer: BC Managed Care – PPO

## 2013-03-01 LAB — RAPID STREP SCREEN (MED CTR MEBANE ONLY): Streptococcus, Group A Screen (Direct): NEGATIVE

## 2013-03-01 MED ORDER — RACEPINEPHRINE HCL 2.25 % IN NEBU
0.5000 mL | INHALATION_SOLUTION | Freq: Once | RESPIRATORY_TRACT | Status: AC
  Administered 2013-03-01: 0.5 mL via RESPIRATORY_TRACT
  Filled 2013-03-01: qty 0.5

## 2013-03-01 MED ORDER — DIPHENHYDRAMINE HCL 50 MG/ML IJ SOLN
50.0000 mg | Freq: Once | INTRAMUSCULAR | Status: AC
  Administered 2013-03-01: 50 mg via INTRAVENOUS
  Filled 2013-03-01: qty 1

## 2013-03-01 MED ORDER — DEXAMETHASONE SODIUM PHOSPHATE 10 MG/ML IJ SOLN
10.0000 mg | Freq: Once | INTRAMUSCULAR | Status: AC
  Administered 2013-03-01: 10 mg via INTRAVENOUS
  Filled 2013-03-01: qty 1

## 2013-03-01 MED ORDER — DIPHENHYDRAMINE HCL 25 MG PO TABS: 50.0000 mg | ORAL_TABLET | Freq: Four times a day (QID) | ORAL | Status: DC | PRN

## 2013-03-01 MED ORDER — FAMOTIDINE 20 MG PO TABS: 20.0000 mg | ORAL_TABLET | Freq: Two times a day (BID) | ORAL | Status: DC | PRN

## 2013-03-01 MED ORDER — FAMOTIDINE IN NACL 20-0.9 MG/50ML-% IV SOLN
20.0000 mg | Freq: Once | INTRAVENOUS | Status: AC
  Administered 2013-03-01: 20 mg via INTRAVENOUS
  Filled 2013-03-01: qty 50

## 2013-03-01 MED ORDER — SODIUM CHLORIDE 0.9 % IV SOLN
INTRAVENOUS | Status: DC
  Administered 2013-03-01: via INTRAVENOUS

## 2013-03-01 NOTE — ED Provider Notes (Signed)
CSN: [id number]     Arrival date & time 02/28/13  2353 History     First MD Initiated Contact with Patient 03/01/13 0009     Chief Complaint  Patient presents with  . Throat Pain and Swelling    (Consider location/radiation/quality/duration/timing/severity/associated sxs/prior Treatment) HPI This is a 42 year old male from a nap about 45 minutes ago with the sensation that his uvula was swollen. It is causing him to gag and there is moderate pain when he swallows. There is no associated itching, rash, wheezing, fever or shortness of breath. He has vomited once as a result of gagging. He is not aware of anything that may have triggered this.  Past Medical History  Diagnosis Date  . Hypertension   . Acid reflux   . Strep throat   . Tonsillitis   . Laryngitis    History reviewed. No pertinent past surgical history. No family history on file. History  Substance Use Topics  . Smoking status: Current Every Day Smoker -- 0.50 packs/day  . Smokeless tobacco: Not on file  . Alcohol Use: No    Review of Systems  All other systems reviewed and are negative.    Allergies  Review of patient's allergies indicates no known allergies.  Home Medications   Current Outpatient Rx  Name  Route  Sig  Dispense  Refill  . amLODipine (NORVASC) 10 MG tablet   Oral   Take 10 mg by mouth daily.         Marland Kitchen esomeprazole (NEXIUM) 40 MG capsule   Oral   Take 40 mg by mouth 2 (two) times daily.           . Esomeprazole Magnesium (NEXIUM PO)   Oral   Take 40 mg by mouth 2 (two) times daily.          Marland Kitchen ibuprofen (ADVIL,MOTRIN) 200 MG tablet   Oral   Take 800 mg by mouth every 8 (eight) hours as needed. OTC FOR PAIN OR headache         . oxyCODONE-acetaminophen (PERCOCET/ROXICET) 5-325 MG per tablet   Oral   Take 1 tablet by mouth every 4 (four) hours as needed for pain.   20 tablet   0   . Tamsulosin HCl (FLOMAX) 0.4 MG CAPS   Oral   Take 1 capsule (0.4 mg total) by mouth  daily.   14 capsule   0   . varenicline (CHANTIX) 1 MG tablet   Oral   Take 1 mg by mouth 2 (two) times daily.           . Varenicline Tartrate (CHANTIX PO)   Oral   Take 1 mg by mouth 2 (two) times daily. Patient is on his  Last month          BP 130/76  Pulse 64  Temp(Src) 97.6 F (36.4 C) (Oral)  Resp 20  SpO2 97%  Physical Exam General: Well-developed, well-nourished male in no acute distress; appearance consistent with age of record HENT: normocephalic; atraumatic; edema of uvula without erythema or exudate; no dysphonia; no stridor Eyes: pupils equal, round and reactive to light; extraocular muscles intact Neck: supple Heart: regular rate and rhythm Lungs: clear to auscultation bilaterally Abdomen: soft; nondistended Extremities: No deformity; full range of motion Neurologic: Awake, alert and oriented; motor function intact in all extremities and symmetric; no facial droop Skin: Warm and dry Psychiatric: Anxious    ED Course   Procedures (including critical care time)   MDM  Nursing notes and vitals signs, including pulse oximetry, reviewed.  Summary of this visit's results, reviewed by myself:  Labs:  Results for orders placed during the hospital encounter of 02/28/13 (from the past 24 hour(s))  RAPID STREP SCREEN     Status: None   Collection Time    03/01/13 12:15 AM      Result Value Range   Streptococcus, Group A Screen (Direct) NEGATIVE  NEGATIVE    Imaging Studies: Dg Neck Soft Tissue  03/01/2013   *RADIOLOGY REPORT*  Clinical Data: The patient awoke with swelling of the uvula. Difficulty swallowing.  Sore throat.  NECK SOFT TISSUES - 1+ VIEW  Comparison: CT head 03/24/2007  Findings: No significant prevertebral or submental soft tissue swelling.  No radiopaque foreign bodies.  Epiglottis and aryepiglottic folds are not thickened.  Normal alignment of the cervical spine.  IMPRESSION: No discrete soft tissue swelling or radiopaque foreign  bodies demonstrated in the neck.   Original Report Authenticated By: Burman Nieves, M.D.    1:32 AM Resting comfortably after IV medications but uvula remains edematous.  5:01 AM Patient continues to be able to rest comfortably with no respiratory difficulty. His uvular swelling has not increased or spread to surrounding tissue. There is no fever, erythema or exudate to suggest infection, and appearance is that of angioedema. We will continue him on Benadryl and Pepcid for the next few days. He was advised to return for worsening symptoms or difficulty breathing.  Hanley Seamen, MD 03/01/13 [phone]

## 2013-03-01 NOTE — ED Notes (Signed)
Patient able to speak in complete sentences. States that it is hard to swallow.

## 2013-03-03 LAB — CULTURE, GROUP A STREP

## 2013-03-19 IMAGING — CT CT ABD-PELV W/ CM
2 of 4 series · 17 of 46 positions shown, 19 images · IV contrast (agent unspecified)
Comparison: 10/15/2008

CLINICAL DATA: Right lower quadrant pain

CT ABDOMEN AND PELVIS WITH CONTRAST
TECHNIQUE: Multidetector CT imaging of the abdomen and pelvis was
performed following the standard protocol during bolus
administration of intravenous contrast.
Contrast: 100 ml [id number]-S22

[Series 2: abd_pel_with 5.0 b40f · axial · 0.79mm/px · z∈[-422,+28]mm · 14 of 100 slices shown, 16 images]
[im 5/100  soft-tissue]
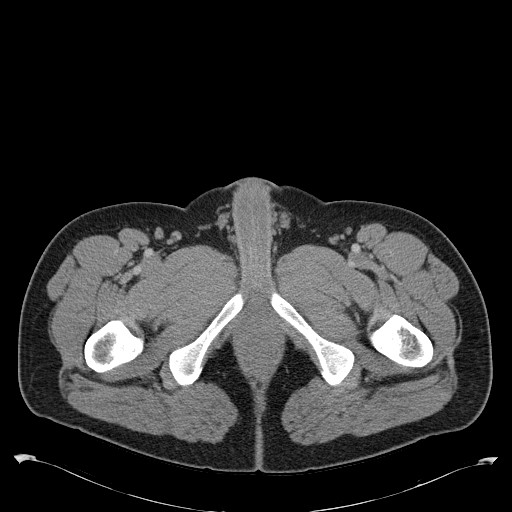
[im 5/100  bone]
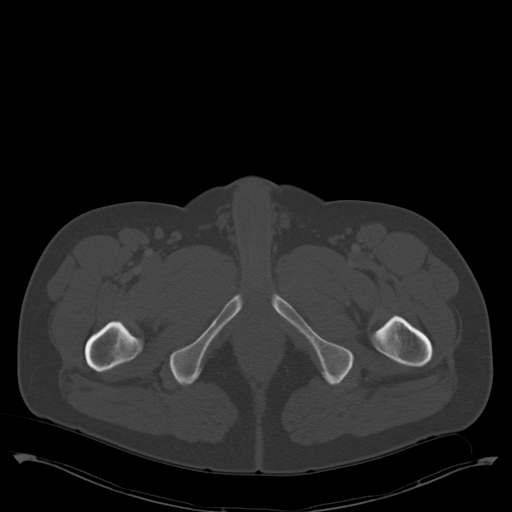
[im 14/100  soft-tissue]
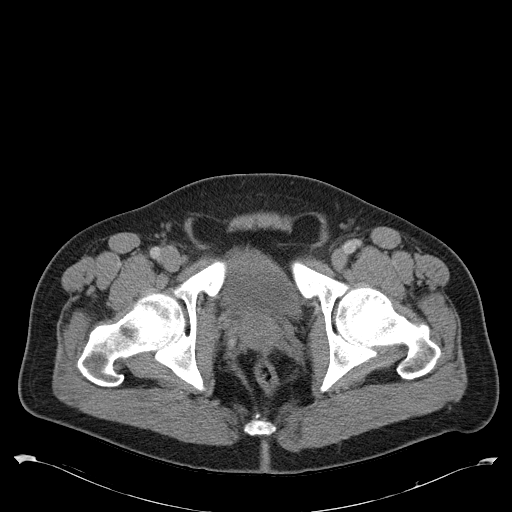
[im 19/100  soft-tissue]
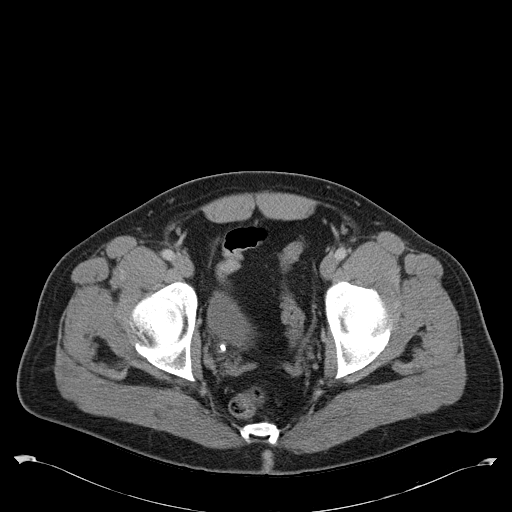
[im 28/100  soft-tissue]
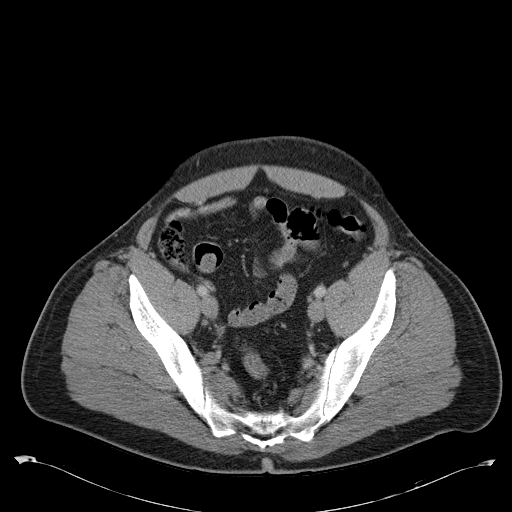
[im 32/100  soft-tissue]
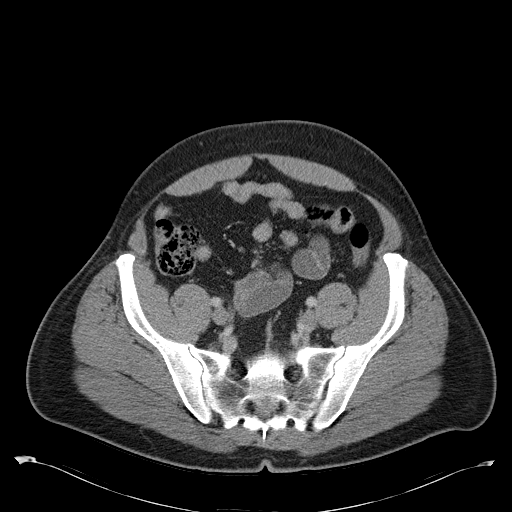
[im 41/100  soft-tissue]
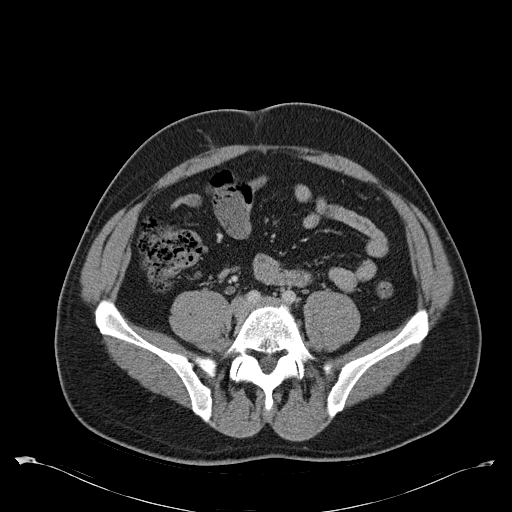
[im 46/100  soft-tissue]
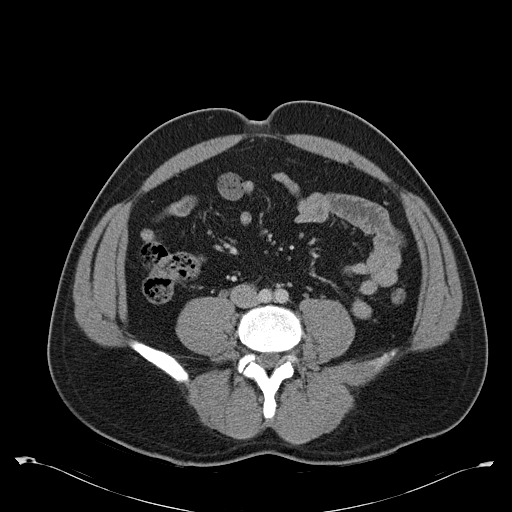
[im 55/100  soft-tissue]
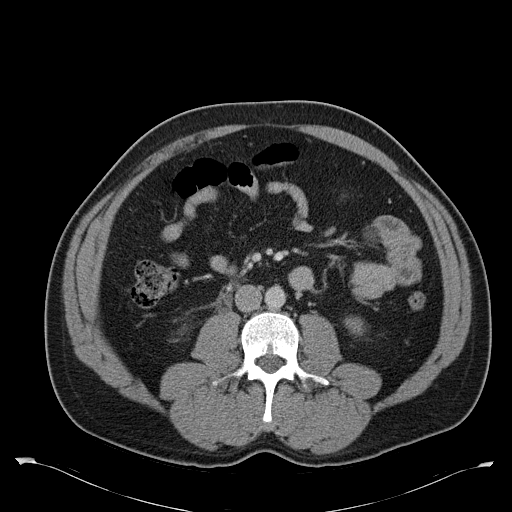
[im 59/100  soft-tissue]
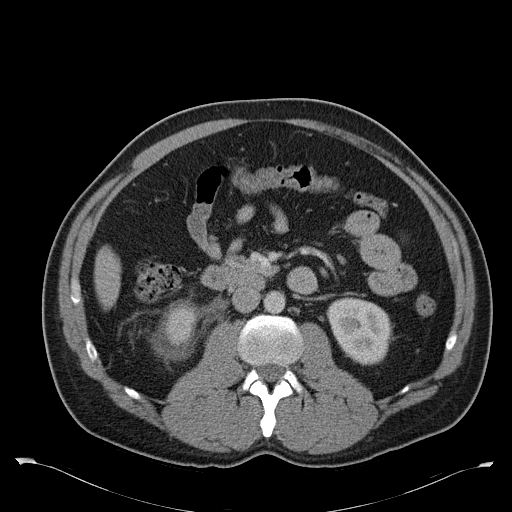
[im 59/100  bone]
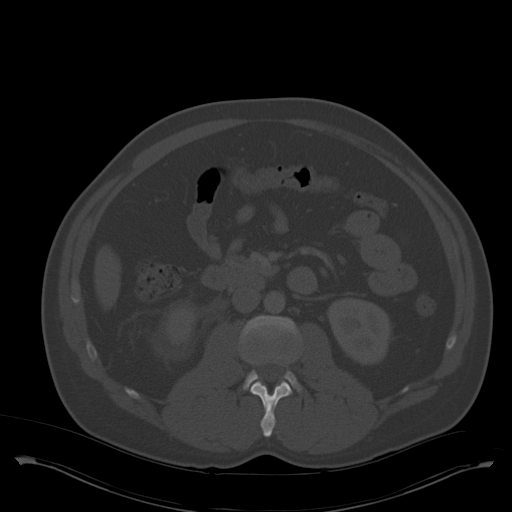
[im 68/100  soft-tissue]
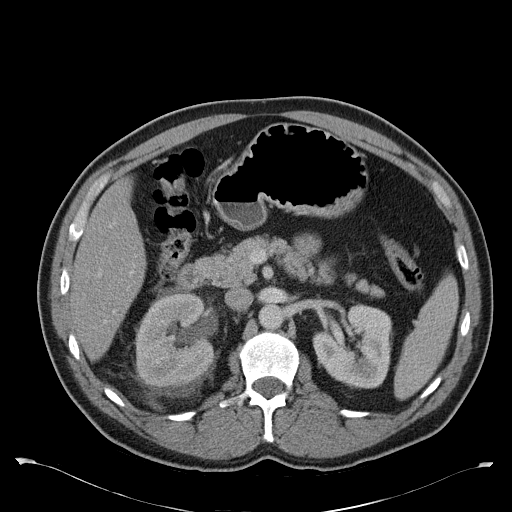
[im 73/100  soft-tissue]
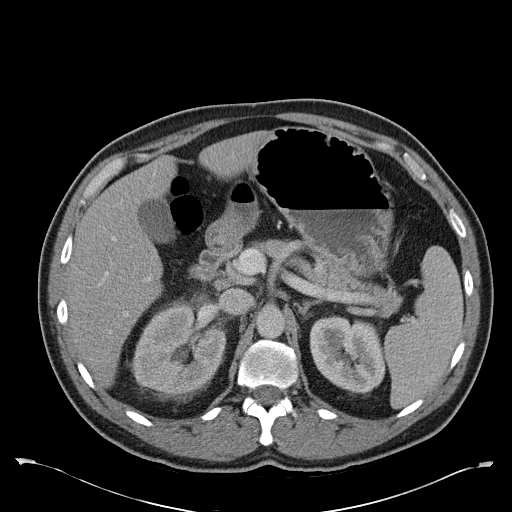
[im 82/100  soft-tissue]
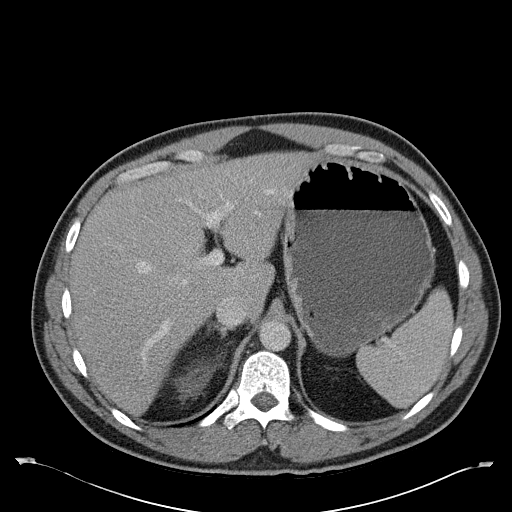
[im 86/100  soft-tissue]
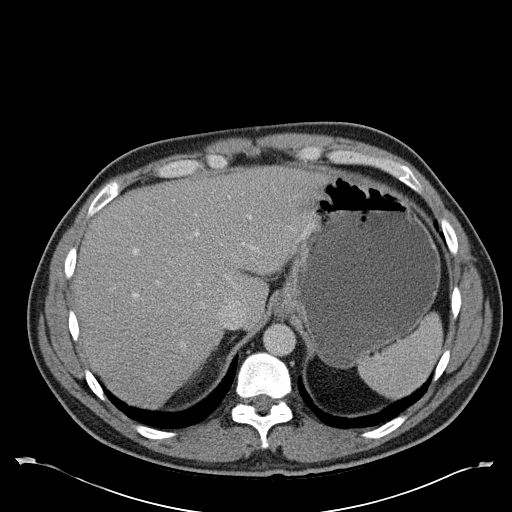
[im 95/100  soft-tissue]
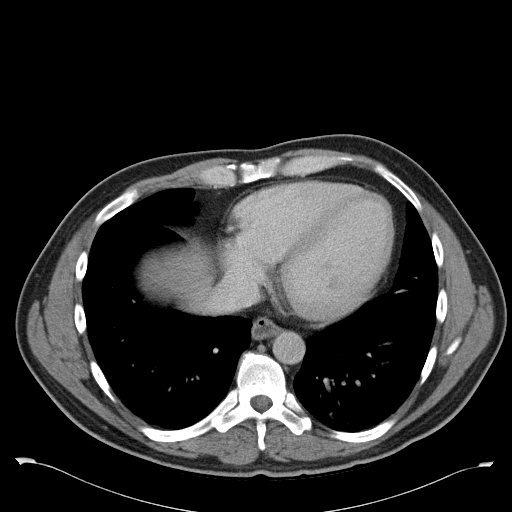

[Series 5: abd_pel_with 3.0 spo cor · coronal · 0.81mm/px · 3 of 90 slices shown]
[im 30/90  soft-tissue]
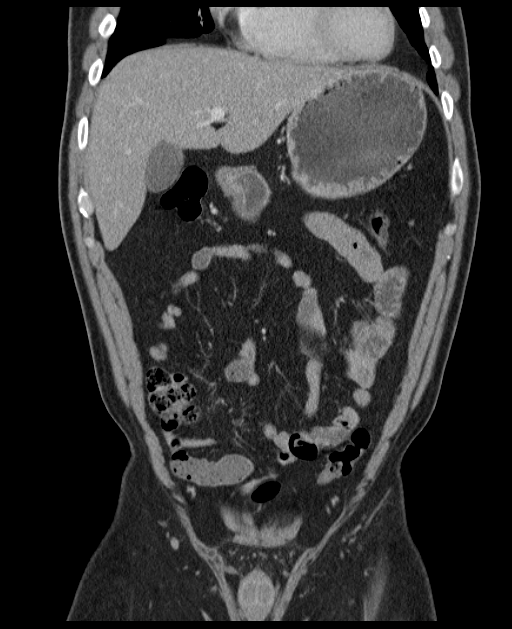
[im 40/90  soft-tissue]
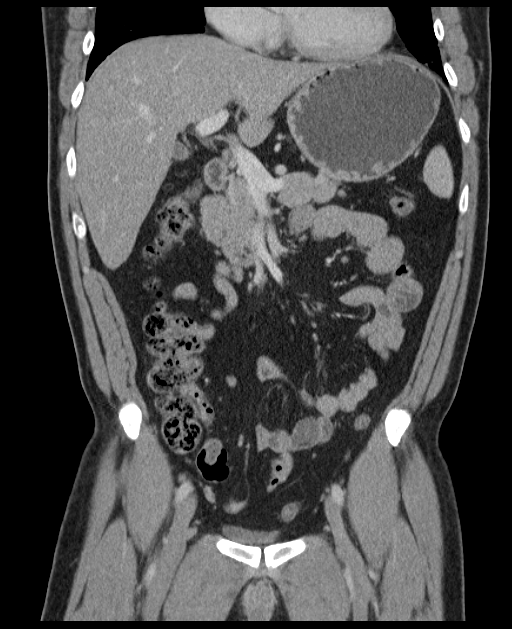
[im 50/90  soft-tissue]
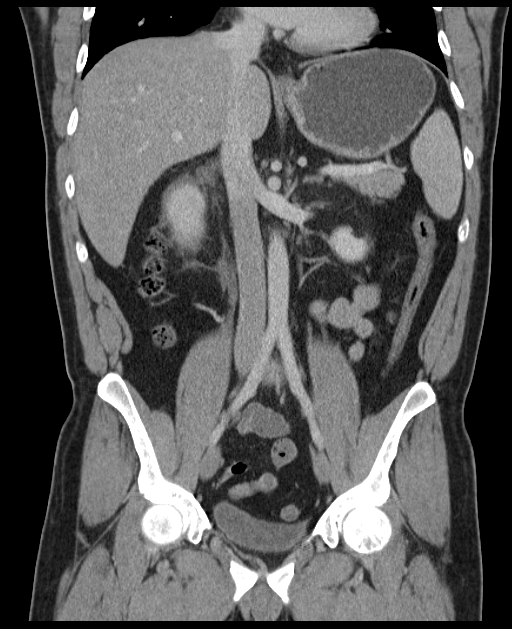

[17 of 46 positions shown; findings below may reference images not displayed]

FINDINGS: Normal appendix.

Moderate right hydronephrosis and perinephric stranding.  Delayed
right contrast excretion.  5 mm ureteral calculus just above the
right ureteral vesicle junction.

Spleen, pancreas, gallbladder are within normal limits.  Diffuse
hepatic steatosis.  Simple cyst in the left kidney.

No free fluid.  Bladder is within normal limits.  Prostate is
unremarkable.
IMPRESSION: 5 mm distal right ureteral calculus and secondary findings of right
ureteral obstruction.

Normal appendix.

## 2013-03-23 IMAGING — CR DG ABDOMEN 1V
1 series · 2 of 2 positions shown · non-contrast
Comparison: none

REASON FOR EXAM: nephrolithaisis
COMMENTS:

[Series 1: view not recorded · 0.17mm/px · 2 of 2 slices shown]
[im 1/2]
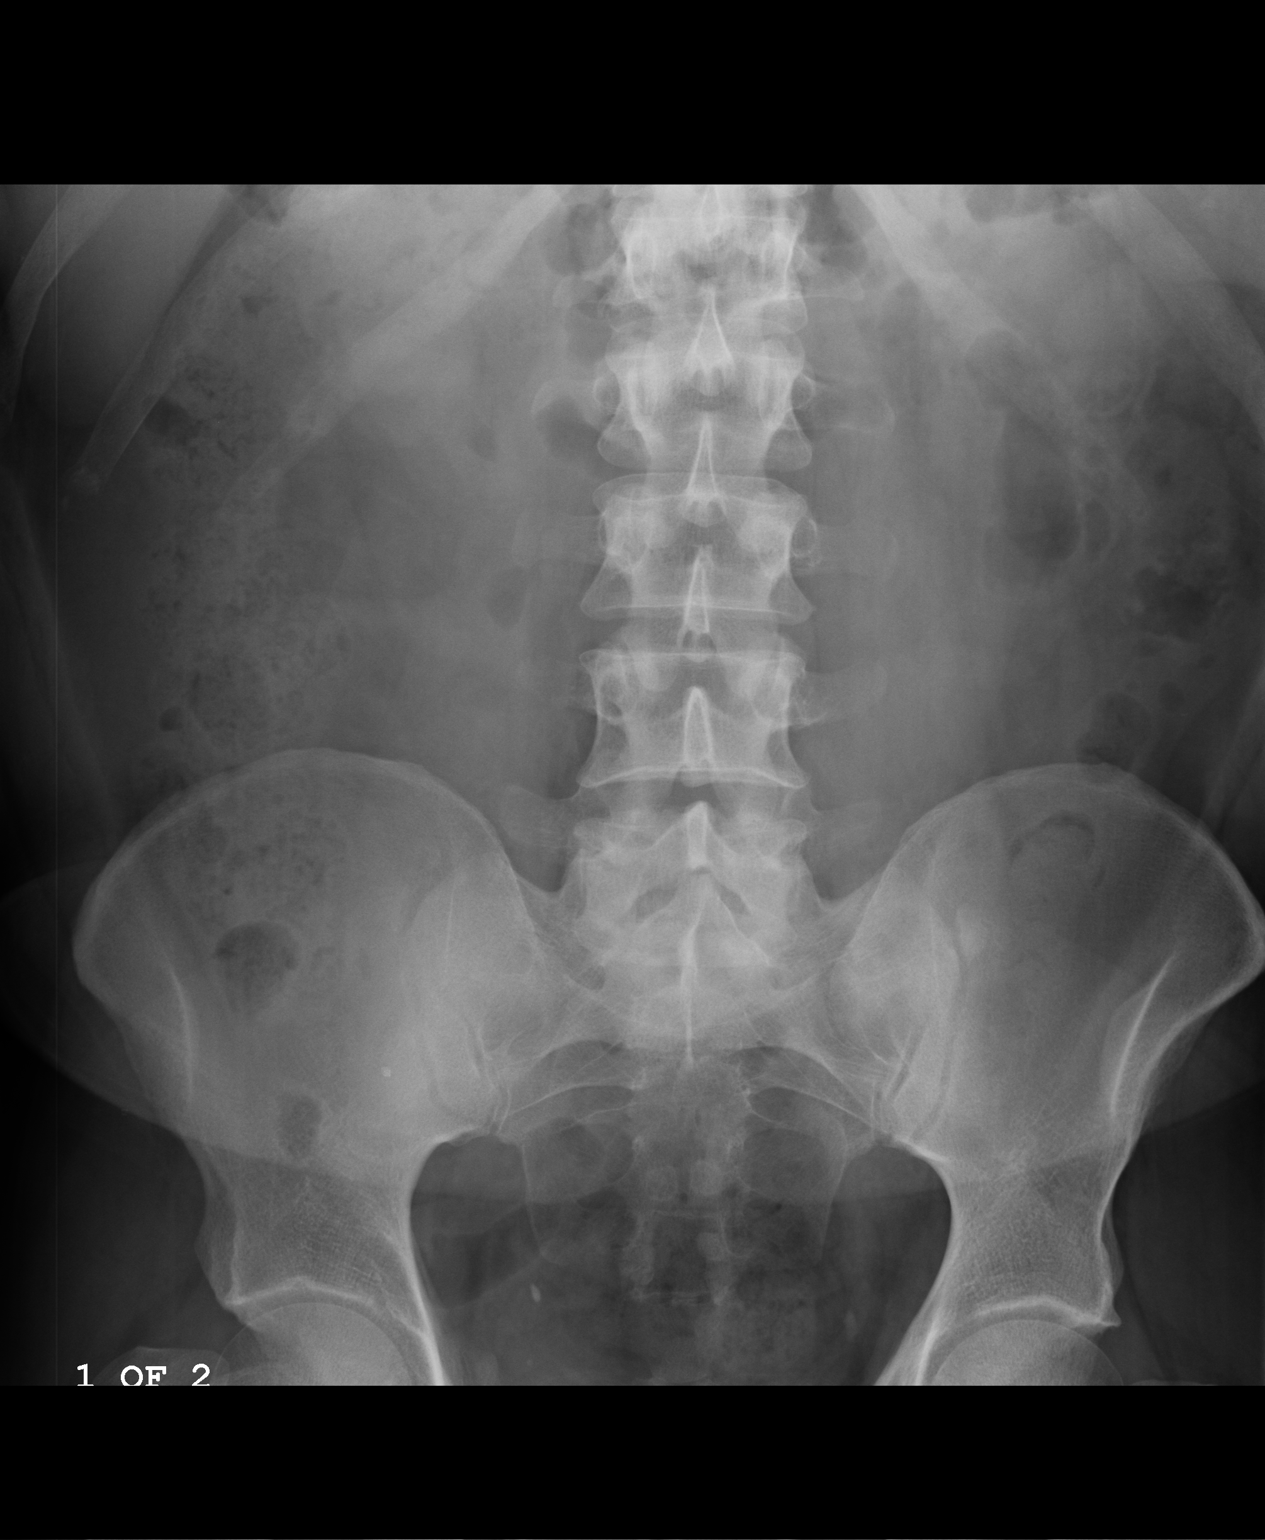
[im 2/2]
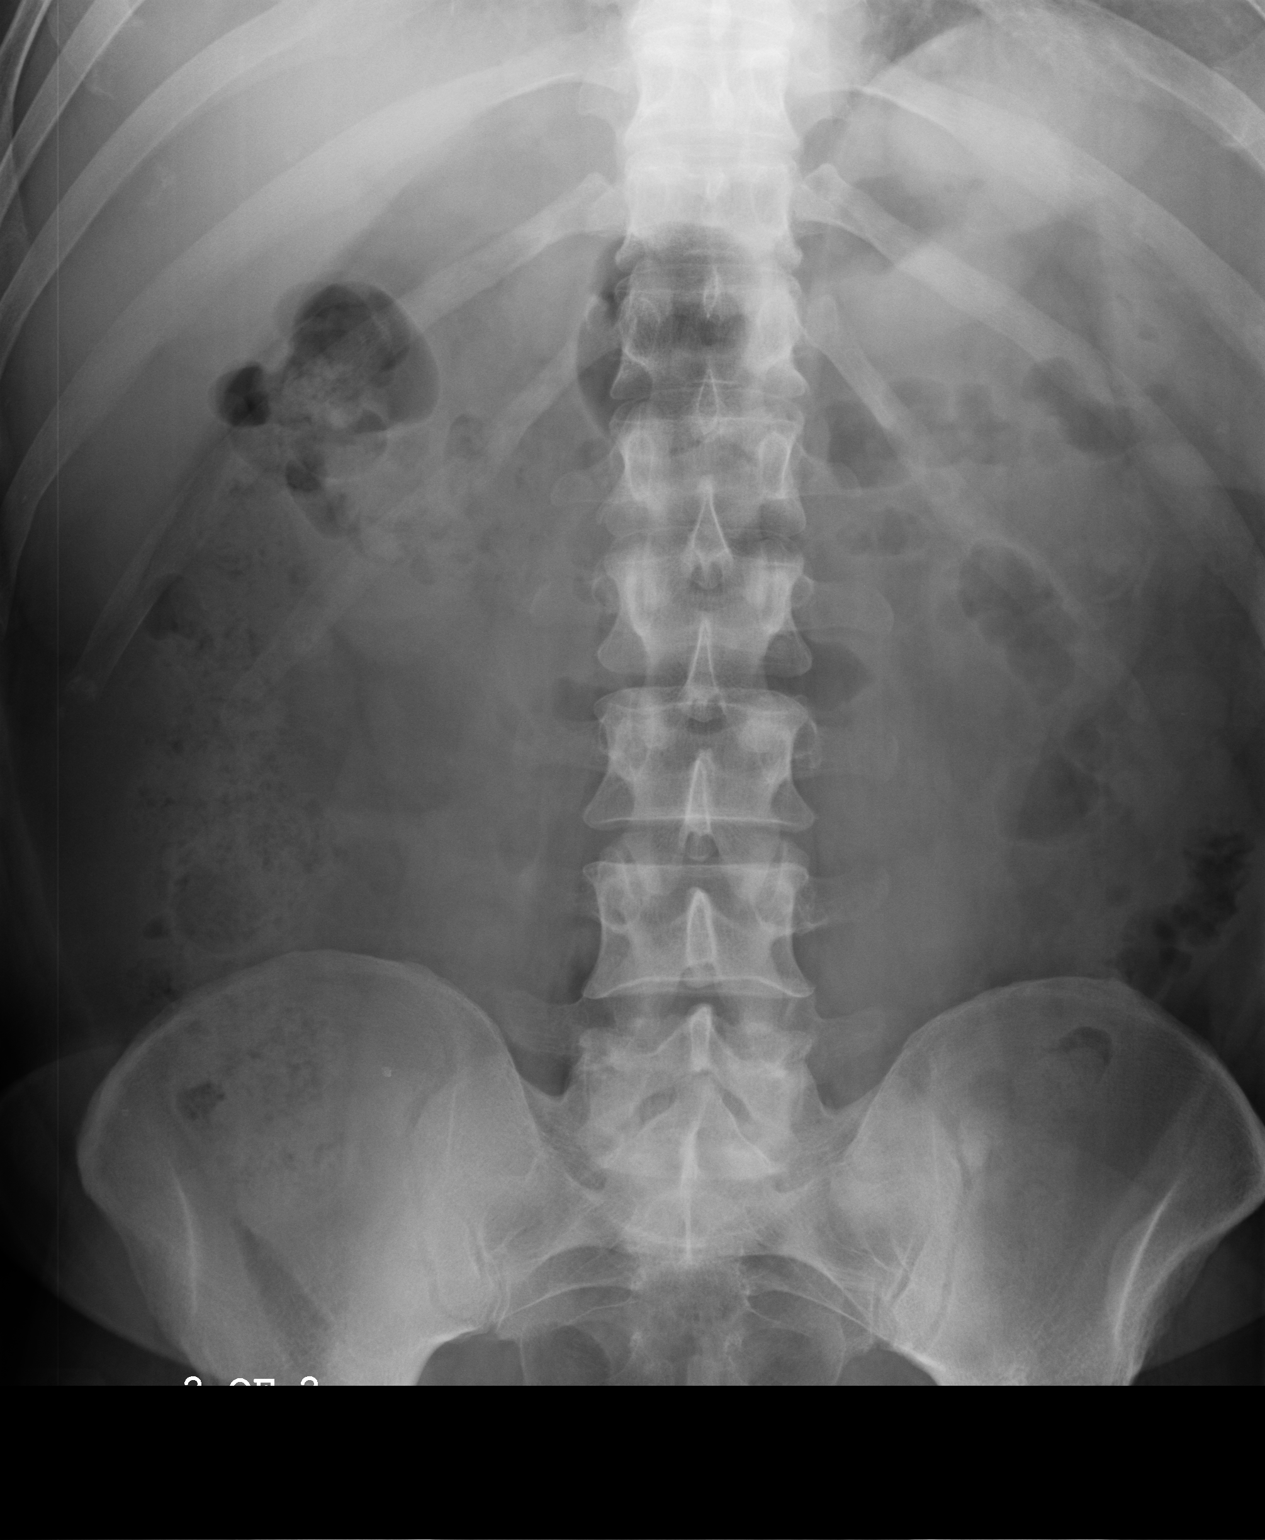

[2 of 2 positions shown; findings below may reference images not displayed]

PROCEDURE:     DXR - DXR KIDNEY URETER BLADDER  - February 15, 2011 [DATE]

RESULT:     The bowel gas pattern is within the limits of normal. There is
an approximately 2 x 4 mm diameter calcification just to the right of the
lower aspect of the sacrum. There is a fainter and more coarse calcification
on the left. The right right-sided calcification likely reflects a distal
ureteral stone. On the left the finding may be a phlebolith.
IMPRESSION: There is a fairly dense calcification in the true bony
pelvis on the right which likely reflects a stone. Correlation with the site
of symptoms is needed.

## 2015-07-07 ENCOUNTER — Emergency Department (HOSPITAL_COMMUNITY)
Admission: EM | Admit: 2015-07-07 | Discharge: 2015-07-07 | Disposition: A | Discharge status: Home / Self Care | Payer: 59 | Attending: Emergency Medicine | Admitting: Emergency Medicine

## 2015-07-07 ENCOUNTER — Encounter (HOSPITAL_COMMUNITY): Payer: Self-pay | Admitting: Emergency Medicine

## 2015-07-07 DIAGNOSIS — Z23 Encounter for immunization: Secondary | ICD-10-CM | POA: Diagnosis not present

## 2015-07-07 DIAGNOSIS — Z79899 Other long term (current) drug therapy: Secondary | ICD-10-CM | POA: Insufficient documentation

## 2015-07-07 DIAGNOSIS — Y9389 Activity, other specified: Secondary | ICD-10-CM | POA: Diagnosis not present

## 2015-07-07 DIAGNOSIS — W272XXA Contact with scissors, initial encounter: Secondary | ICD-10-CM | POA: Diagnosis not present

## 2015-07-07 DIAGNOSIS — Z8709 Personal history of other diseases of the respiratory system: Secondary | ICD-10-CM | POA: Insufficient documentation

## 2015-07-07 DIAGNOSIS — Y998 Other external cause status: Secondary | ICD-10-CM | POA: Diagnosis not present

## 2015-07-07 DIAGNOSIS — I10 Essential (primary) hypertension: Secondary | ICD-10-CM | POA: Insufficient documentation

## 2015-07-07 DIAGNOSIS — K219 Gastro-esophageal reflux disease without esophagitis: Secondary | ICD-10-CM | POA: Insufficient documentation

## 2015-07-07 DIAGNOSIS — Y9289 Other specified places as the place of occurrence of the external cause: Secondary | ICD-10-CM | POA: Diagnosis not present

## 2015-07-07 DIAGNOSIS — F17210 Nicotine dependence, cigarettes, uncomplicated: Secondary | ICD-10-CM | POA: Insufficient documentation

## 2015-07-07 DIAGNOSIS — S61412A Laceration without foreign body of left hand, initial encounter: Secondary | ICD-10-CM | POA: Diagnosis present

## 2015-07-07 MED ORDER — LIDOCAINE HCL (PF) 1 % IJ SOLN
INTRAMUSCULAR | Status: AC
  Administered 2015-07-07: 17:00:00
  Filled 2015-07-07: qty 5

## 2015-07-07 MED ORDER — TETANUS-DIPHTH-ACELL PERTUSSIS 5-2.5-18.5 LF-MCG/0.5 IM SUSP
0.5000 mL | Freq: Once | INTRAMUSCULAR | Status: AC
  Administered 2015-07-07: 0.5 mL via INTRAMUSCULAR
  Filled 2015-07-07: qty 0.5

## 2015-07-07 NOTE — ED Provider Notes (Signed)
CSN: [id number]     Arrival date & time 07/07/15  1429 History   First MD Initiated Contact with Patient 07/07/15 1608     Chief Complaint  Patient presents with  . Laceration     (Consider location/radiation/quality/duration/timing/severity/associated sxs/prior Treatment) Patient is a 44 y.o. male presenting with skin laceration. The history is provided by the patient.  Laceration Location:  Hand (web space of the left hand) Length (cm):  1.5 Depth:  Cutaneous Quality: straight   Bleeding: controlled   Time since incident:  3 hours Injury mechanism: scissors. Pain details:    Quality:  Aching   Severity:  Mild   Timing:  Intermittent   Progression:  Unchanged Foreign body present:  No foreign bodies Relieved by:  Pressure Worsened by:  Movement Tetanus status:  Out of date   Past Medical History  Diagnosis Date  . Hypertension   . Acid reflux   . Strep throat   . Tonsillitis   . Laryngitis    Past Surgical History  Procedure Laterality Date  . Wisdom teeth     History reviewed. No pertinent family history. Social History  Substance Use Topics  . Smoking status: Current Every Day Smoker -- 0.50 packs/day  . Smokeless tobacco: None  . Alcohol Use: No    Review of Systems  Constitutional: Negative for activity change.       All ROS Neg except as noted in HPI  HENT: Negative for nosebleeds.   Eyes: Negative for photophobia and discharge.  Respiratory: Negative for cough, shortness of breath and wheezing.   Cardiovascular: Negative for chest pain and palpitations.  Gastrointestinal: Negative for abdominal pain and blood in stool.  Genitourinary: Negative for dysuria, frequency and hematuria.  Musculoskeletal: Negative for back pain, arthralgias and neck pain.  Skin: Negative.   Neurological: Negative for dizziness, seizures and speech difficulty.  Psychiatric/Behavioral: Negative for hallucinations and confusion.      Allergies  Review of patient's  allergies indicates no known allergies.  Home Medications   Prior to Admission medications   Medication Sig Start Date End Date Taking? Authorizing Provider  amLODipine (NORVASC) 10 MG tablet Take 10 mg by mouth daily.    Historical Provider, MD  diphenhydrAMINE (BENADRYL) 25 MG tablet Take 2 tablets (50 mg total) by mouth every 6 (six) hours as needed (swelling of uvula). 03/01/13   John Molpus, MD  esomeprazole (NEXIUM) 40 MG capsule Take 40 mg by mouth 2 (two) times daily.      Historical Provider, MD  Esomeprazole Magnesium (NEXIUM PO) Take 40 mg by mouth 2 (two) times daily.     Historical Provider, MD  famotidine (PEPCID) 20 MG tablet Take 1 tablet (20 mg total) by mouth 2 (two) times daily as needed (swelling of the uvula). 03/01/13   John Molpus, MD  ibuprofen (ADVIL,MOTRIN) 200 MG tablet Take 800 mg by mouth every 8 (eight) hours as needed. OTC FOR PAIN OR headache    Historical Provider, MD  oxyCODONE-acetaminophen (PERCOCET/ROXICET) 5-325 MG per tablet Take 1 tablet by mouth every 4 (four) hours as needed for pain. 04/09/12   Jola Schmidt, MD  Tamsulosin HCl (FLOMAX) 0.4 MG CAPS Take 1 capsule (0.4 mg total) by mouth daily. 02/11/11   Calvert Cantor, MD  varenicline (CHANTIX) 1 MG tablet Take 1 mg by mouth 2 (two) times daily.      Historical Provider, MD  Varenicline Tartrate (CHANTIX PO) Take 1 mg by mouth 2 (two) times daily. Patient is on  his  Last month    Historical Provider, MD   BP 141/78 mmHg  Pulse 74  Temp(Src) 98 F (36.7 C)  Resp 20  Ht 6' (1.829 m)  Wt 93.895 kg  BMI 28.07 kg/m2  SpO2 99% Physical Exam  Musculoskeletal:       Hands:   ED Course  .Marland KitchenLaceration Repair Date/Time: 07/07/2015 5:25 PM Performed by: Lily Kocher Authorized by: Lily Kocher Consent: Verbal consent obtained. Risks and benefits: risks, benefits and alternatives were discussed Consent given by: patient Patient understanding: patient states understanding of the procedure being  performed Patient identity confirmed: arm band Time out: Immediately prior to procedure a "time out" was called to verify the correct patient, procedure, equipment, support staff and site/side marked as required. Body area: upper extremity (web space of the thumb and index finger left hand) Laceration length: 1.5 cm Foreign bodies: no foreign bodies Tendon involvement: none Anesthesia: local infiltration Local anesthetic: lidocaine 1% without epinephrine Patient sedated: no Preparation: Patient was prepped and draped in the usual sterile fashion. Irrigation solution: tap water Amount of cleaning: standard Skin closure: staples Number of sutures: 3 Approximation: close Dressing: gauze roll Patient tolerance: Patient tolerated the procedure well with no immediate complications   (including critical care time) Labs Review Labs Reviewed - No data to display  Imaging Review No results found. I have personally reviewed and evaluated these images and lab results as part of my medical decision-making.   EKG Interpretation None      MDM  Patient sustained a 1.5 cm laceration of the left hand using utility scissors. Wound was repaired with 3 staples. Patient is to have the staples removed in 7-10 days. Patient will return sooner if any signs of advancing infection. No neurovascular compromise is appreciated.    Final diagnoses:  None    *I have reviewed nursing notes, vital signs, and all appropriate lab and imaging results for this patient.[address] PA-C 07/07/15 1728  Milton Ferguson, MD 07/11/15 1524

## 2015-07-07 NOTE — ED Notes (Signed)
PT states obtained laceration with pair of utility scissor while working on his truck. Laceration noted to posterior hand with bleeding controlled.

## 2015-07-07 NOTE — Discharge Instructions (Signed)
Please keep your wound clean and dry. Have your staples removed in 7 or 8 days. Stitches, Staples, or Adhesive Wound Closure Health care providers use stitches (sutures), staples, and certain glue (skin adhesives) to hold skin together while it heals (wound closure). You may need this treatment after you have surgery or if you cut your skin accidentally. These methods help your skin to heal more quickly and make it less likely that you will have a scar. A wound may take several months to heal completely. The type of wound you have determines when your wound gets closed. In most cases, the wound is closed as soon as possible (primary skin closure). Sometimes, closure is delayed so the wound can be cleaned and allowed to heal naturally. This reduces the chance of infection. Delayed closure may be needed if your wound:  Is caused by a bite.  Happened more than 6 hours ago.  Involves loss of skin or the tissues under the skin.  Has dirt or debris in it that cannot be removed.  Is infected. WHAT ARE THE DIFFERENT KINDS OF WOUND CLOSURES? There are many options for wound closure. The one that your health care provider uses depends on how deep and how large your wound is. Adhesive Glue To use this type of glue to close a wound, your health care provider holds the edges of the wound together and paints the glue on the surface of your skin. You may need more than one layer of glue. Then the wound may be covered with a light bandage (dressing). This type of skin closure may be used for small wounds that are not deep (superficial). Using glue for wound closure is less painful than other methods. It does not require a medicine that numbs the area (local anesthetic). This method also leaves nothing to be removed. Adhesive glue is often used for children and on facial wounds. Adhesive glue cannot be used for wounds that are deep, uneven, or bleeding. It is not used inside of a wound.  Adhesive Strips These  strips are made of sticky (adhesive), porous paper. They are applied across your skin edges like a regular adhesive bandage. You leave them on until they fall off. Adhesive strips may be used to close very superficial wounds. They may also be used along with sutures to improve the closure of your skin edges.  Sutures Sutures are the oldest method of wound closure. Sutures can be made from natural substances, such as silk, or from synthetic materials, such as nylon and steel. They can be made from a material that your body can break down as your wound heals (absorbable), or they can be made from a material that needs to be removed from your skin (nonabsorbable). They come in many different strengths and sizes. Your health care provider attaches the sutures to a steel needle on one end. Sutures can be passed through your skin, or through the tissues beneath your skin. Then they are tied and cut. Your skin edges may be closed in one continuous stitch or in separate stitches. Sutures are strong and can be used for all kinds of wounds. Absorbable sutures may be used to close tissues under the skin. The disadvantage of sutures is that they may cause skin reactions that lead to infection. Nonabsorbable sutures need to be removed. Staples When surgical staples are used to close a wound, the edges of your skin on both sides of the wound are brought close together. A staple is placed across the  wound, and an instrument secures the edges together. Staples are often used to close surgical cuts (incisions). Staples are faster to use than sutures, and they cause less skin reaction. Staples need to be removed using a tool that bends the staples away from your skin. HOW DO I CARE FOR MY WOUND CLOSURE?  Take medicines only as directed by your health care provider.  If you were prescribed an antibiotic medicine for your wound, finish it all even if you start to feel better.  Use ointments or creams only as directed by  your health care provider.  Wash your hands with soap and water before and after touching your wound.  Do not soak your wound in water. Do not take baths, swim, or use a hot tub until your health care provider approves.  Ask your health care provider when you can start showering. Cover your wound if directed by your health care provider.  Do not take out your own sutures or staples.  Do not pick at your wound. Picking can cause an infection.  Keep all follow-up visits as directed by your health care provider. This is important. HOW LONG WILL I HAVE MY WOUND CLOSURE?  Leave adhesive glue on your skin until the glue peels away.  Leave adhesive strips on your skin until the strips fall off.  Absorbable sutures will dissolve within several days.  Nonabsorbable sutures and staples must be removed. The location of the wound will determine how long they stay in. This can range from several days to a couple of weeks. WHEN SHOULD I SEEK HELP FOR MY WOUND CLOSURE? Contact your health care provider if:  You have a fever.  You have chills.  You have drainage, redness, swelling, or pain at your wound.  There is a bad smell coming from your wound.  The skin edges of your wound start to separate after your sutures have been removed.  Your wound becomes thick, raised, and darker in color after your sutures come out (scarring).   This information is not intended to replace advice given to you by your health care provider. Make sure you discuss any questions you have with your health care provider.   Document Released: 03/20/2001 Document Revised: 07/16/2014 Document Reviewed: 12/02/2013 Elsevier Interactive Patient Education [employer].

## 2015-07-15 ENCOUNTER — Emergency Department (HOSPITAL_COMMUNITY)
Admission: EM | Admit: 2015-07-15 | Discharge: 2015-07-15 | Disposition: A | Discharge status: Home / Self Care | Payer: 59 | Attending: Emergency Medicine | Admitting: Emergency Medicine

## 2015-07-15 ENCOUNTER — Encounter (HOSPITAL_COMMUNITY): Payer: Self-pay | Admitting: Cardiology

## 2015-07-15 DIAGNOSIS — Z79899 Other long term (current) drug therapy: Secondary | ICD-10-CM | POA: Diagnosis not present

## 2015-07-15 DIAGNOSIS — Z4802 Encounter for removal of sutures: Secondary | ICD-10-CM | POA: Diagnosis present

## 2015-07-15 DIAGNOSIS — I10 Essential (primary) hypertension: Secondary | ICD-10-CM | POA: Insufficient documentation

## 2015-07-15 DIAGNOSIS — F17200 Nicotine dependence, unspecified, uncomplicated: Secondary | ICD-10-CM | POA: Diagnosis not present

## 2015-07-15 DIAGNOSIS — Z8709 Personal history of other diseases of the respiratory system: Secondary | ICD-10-CM | POA: Diagnosis not present

## 2015-07-15 DIAGNOSIS — K219 Gastro-esophageal reflux disease without esophagitis: Secondary | ICD-10-CM | POA: Diagnosis not present

## 2015-07-15 NOTE — ED Provider Notes (Signed)
CSN: [id number]     Arrival date & time 07/15/15  1316 History   First MD Initiated Contact with Patient 07/15/15 1346     Chief Complaint  Patient presents with  . Suture / Staple Removal     (Consider location/radiation/quality/duration/timing/severity/associated sxs/prior Treatment) The history is provided by the patient.   Rodney Baldwin is a 45 y.o. male presenting with staple removal from his right hand.  He had staples placed here 8 days ago for laceration which occurred from a utility scissors.  He denies any problems with the wound site, no drainage, no swelling or increased pain.     Past Medical History  Diagnosis Date  . Hypertension   . Acid reflux   . Strep throat   . Tonsillitis   . Laryngitis    Past Surgical History  Procedure Laterality Date  . Wisdom teeth     History reviewed. No pertinent family history. Social History  Substance Use Topics  . Smoking status: Current Every Day Smoker -- 0.50 packs/day  . Smokeless tobacco: None  . Alcohol Use: No    Review of Systems  Constitutional: Negative for fever and chills.  Respiratory: Negative for shortness of breath and wheezing.   Skin: Positive for wound.  Neurological: Negative for numbness.      Allergies  Review of patient's allergies indicates no known allergies.  Home Medications   Prior to Admission medications   Medication Sig Start Date End Date Taking? Authorizing Provider  amLODipine (NORVASC) 10 MG tablet Take 10 mg by mouth daily.   Yes Historical Provider, MD  aspirin-acetaminophen-caffeine (EXCEDRIN MIGRAINE) [phone] MG tablet Take 1 tablet by mouth every 6 (six) hours as needed for headache.   Yes Historical Provider, MD  esomeprazole (NEXIUM) 40 MG capsule Take 40 mg by mouth 2 (two) times daily.     Yes Historical Provider, MD  ibuprofen (ADVIL,MOTRIN) 200 MG tablet Take 800 mg by mouth every 8 (eight) hours as needed. OTC FOR PAIN OR headache   Yes Historical Provider, MD    BP 125/70 mmHg  Pulse 62  Temp(Src) 97.8 F (36.6 C)  Resp 18  SpO2 99% Physical Exam  Constitutional: He is oriented to person, place, and time. He appears well-developed and well-nourished.  HENT:  Head: Normocephalic.  Cardiovascular: Normal rate.   Pulmonary/Chest: Effort normal.  Neurological: He is alert and oriented to person, place, and time. No sensory deficit.  Skin: Laceration noted.  Well-healed laceration of right dorsal webspace between thumb and index finger.  No edema, no erythema or drainage.    ED Course  Procedures (including critical care time) Labs Review Labs Reviewed - No data to display  Imaging Review No results found. I have personally reviewed and evaluated these images and lab results as part of my medical decision-making.   EKG Interpretation None      MDM   Final diagnoses:  Encounter for staple removal    When necessary follow-up anticipated.  Staples were removed by RN without difficulty.    Evalee Jefferson, PA-C 07/15/15 1454  Milton Ferguson, MD 07/15/15 [phone]

## 2015-07-15 NOTE — ED Notes (Signed)
Removed 3 staples without difficulty.  Band-Aid applied.

## 2015-07-15 NOTE — Discharge Instructions (Signed)

## 2015-07-15 NOTE — ED Notes (Signed)
Staple removal left hand

## 2016-01-14 ENCOUNTER — Emergency Department (HOSPITAL_COMMUNITY): Payer: 59

## 2016-01-14 ENCOUNTER — Emergency Department (HOSPITAL_COMMUNITY)
Admission: EM | Admit: 2016-01-14 | Discharge: 2016-01-14 | Disposition: A | Discharge status: Home / Self Care | Payer: 59 | Attending: Emergency Medicine | Admitting: Emergency Medicine

## 2016-01-14 ENCOUNTER — Encounter (HOSPITAL_COMMUNITY): Payer: Self-pay | Admitting: Emergency Medicine

## 2016-01-14 DIAGNOSIS — M2550 Pain in unspecified joint: Secondary | ICD-10-CM | POA: Insufficient documentation

## 2016-01-14 DIAGNOSIS — L089 Local infection of the skin and subcutaneous tissue, unspecified: Secondary | ICD-10-CM

## 2016-01-14 DIAGNOSIS — F17200 Nicotine dependence, unspecified, uncomplicated: Secondary | ICD-10-CM | POA: Insufficient documentation

## 2016-01-14 DIAGNOSIS — I10 Essential (primary) hypertension: Secondary | ICD-10-CM | POA: Insufficient documentation

## 2016-01-14 DIAGNOSIS — Z791 Long term (current) use of non-steroidal anti-inflammatories (NSAID): Secondary | ICD-10-CM | POA: Diagnosis not present

## 2016-01-14 DIAGNOSIS — Z79899 Other long term (current) drug therapy: Secondary | ICD-10-CM | POA: Diagnosis not present

## 2016-01-14 DIAGNOSIS — R739 Hyperglycemia, unspecified: Secondary | ICD-10-CM

## 2016-01-14 LAB — CBC WITH DIFFERENTIAL/PLATELET
BASOS ABS: 0 10*3/uL (ref 0.0–0.1)
BASOS PCT: 0 %
EOS ABS: 0.2 10*3/uL (ref 0.0–0.7)
EOS PCT: 3 %
HEMATOCRIT: 40.2 % (ref 39.0–52.0)
Hemoglobin: 13.9 g/dL (ref 13.0–17.0)
LYMPHS ABS: 2.6 10*3/uL (ref 0.7–4.0)
Lymphocytes Relative: 28 %
MCH: 30 pg (ref 26.0–34.0)
MCHC: 34.6 g/dL (ref 30.0–36.0)
MCV: 86.8 fL (ref 78.0–100.0)
Monocytes Absolute: 0.4 10*3/uL (ref 0.1–1.0)
Monocytes Relative: 4 %
NEUTROS PCT: 65 %
Neutro Abs: 6 10*3/uL (ref 1.7–7.7)
PLATELETS: 177 10*3/uL (ref 150–400)
RBC: 4.63 MIL/uL (ref 4.22–5.81)
RDW: 13.1 % (ref 11.5–15.5)
WBC: 9.2 10*3/uL (ref 4.0–10.5)

## 2016-01-14 LAB — COMPREHENSIVE METABOLIC PANEL
ALT: 18 U/L (ref 17–63)
AST: 19 U/L (ref 15–41)
Albumin: 4.2 g/dL (ref 3.5–5.0)
Alkaline Phosphatase: 72 U/L (ref 38–126)
Anion gap: 9 (ref 5–15)
BILIRUBIN TOTAL: 0.6 mg/dL (ref 0.3–1.2)
BUN: 13 mg/dL (ref 6–20)
CALCIUM: 8.9 mg/dL (ref 8.9–10.3)
CO2: 24 mmol/L (ref 22–32)
CREATININE: 0.88 mg/dL (ref 0.61–1.24)
Chloride: 106 mmol/L (ref 101–111)
Glucose, Bld: 194 mg/dL — ABNORMAL HIGH (ref 65–99)
Potassium: 3.2 mmol/L — ABNORMAL LOW (ref 3.5–5.1)
Sodium: 139 mmol/L (ref 135–145)
TOTAL PROTEIN: 7.4 g/dL (ref 6.5–8.1)

## 2016-01-14 LAB — LACTIC ACID, PLASMA: LACTIC ACID, VENOUS: 2 mmol/L — AB (ref 0.5–1.9)

## 2016-01-14 MED ORDER — CLINDAMYCIN HCL 150 MG PO CAPS: 450.0000 mg | ORAL_CAPSULE | Freq: Three times a day (TID) | ORAL | Status: DC

## 2016-01-14 MED ORDER — IBUPROFEN 800 MG PO TABS
800.0000 mg | ORAL_TABLET | Freq: Once | ORAL | Status: AC
  Administered 2016-01-14: 800 mg via ORAL
  Filled 2016-01-14: qty 1

## 2016-01-14 MED ORDER — CLINDAMYCIN PHOSPHATE 600 MG/50ML IV SOLN
600.0000 mg | Freq: Once | INTRAVENOUS | Status: AC
  Administered 2016-01-14: 600 mg via INTRAVENOUS
  Filled 2016-01-14: qty 50

## 2016-01-14 NOTE — ED Notes (Signed)
MD at bedside. 

## 2016-01-14 NOTE — ED Notes (Signed)
Patient states a board fell on his right leg Monday. States by Friday wound had gotten red and swollen. Swelling in right ankle and foot. NAD.

## 2016-01-14 NOTE — ED Provider Notes (Signed)
CSN: [id number]     Arrival date & time 01/14/16  1942 History   First MD Initiated Contact with Patient 01/14/16 2016     Chief Complaint  Patient presents with  . Wound Infection     (Consider location/radiation/quality/duration/timing/severity/associated sxs/prior Treatment) The history is provided by the patient.   Rodney Baldwin is a 45 y.o. male presenting for evaluation of suspected wound infection.  He had a piece of plywood fall off of his truck which slid down his right shin a week ago causing a deep abrasion.  Over the past 2 days the site has become progressively more tender, swollen and now with spreading redness.  He denies fevers, chills, nausea, vomiting and feels otherwise well except for this localized pain.  He has kept the wound site clean and covered.  His tetanus is current.     Past Medical History  Diagnosis Date  . Hypertension   . Acid reflux   . Strep throat   . Tonsillitis   . Laryngitis    Past Surgical History  Procedure Laterality Date  . Wisdom teeth     No family history on file. Social History  Substance Use Topics  . Smoking status: Current Every Day Smoker -- 0.50 packs/day  . Smokeless tobacco: None  . Alcohol Use: No    Review of Systems  Constitutional: Negative for fever, chills and fatigue.  Gastrointestinal: Negative for nausea and vomiting.  Musculoskeletal: Positive for arthralgias. Negative for myalgias.  Skin: Positive for color change and wound.  Neurological: Negative for weakness, numbness and headaches.      Allergies  Review of patient's allergies indicates no known allergies.  Home Medications   Prior to Admission medications   Medication Sig Start Date End Date Taking? Authorizing Provider  amLODipine (NORVASC) 10 MG tablet Take 10 mg by mouth daily.   Yes Historical Provider, MD  aspirin-acetaminophen-caffeine (EXCEDRIN MIGRAINE) [phone] MG tablet Take 1 tablet by mouth every 6 (six) hours as needed for  headache.   Yes Historical Provider, MD  esomeprazole (NEXIUM) 20 MG capsule Take 20 mg by mouth daily at 12 noon.   Yes Historical Provider, MD  ibuprofen (ADVIL,MOTRIN) 200 MG tablet Take 800 mg by mouth every 8 (eight) hours as needed. OTC FOR PAIN OR headache   Yes Historical Provider, MD  clindamycin (CLEOCIN) 150 MG capsule Take 3 capsules (450 mg total) by mouth 3 (three) times daily. 01/14/16   Evalee Jefferson, PA-C   BP 127/80 mmHg  Pulse 74  Temp(Src) 97.9 F (36.6 C) (Oral)  Resp 16  Ht 6' (1.829 m)  Wt 89.812 kg  BMI 26.85 kg/m2  SpO2 100% Physical Exam  Constitutional: He appears well-developed and well-nourished. No distress.  HENT:  Head: Normocephalic and atraumatic.  Eyes: Conjunctivae are normal.  Neck: Normal range of motion. Neck supple.  Cardiovascular: Normal rate, regular rhythm and intact distal pulses.   Pulses:      Dorsalis pedis pulses are 2+ on the right side, and 2+ on the left side.  Pulmonary/Chest: Effort normal and breath sounds normal.  Musculoskeletal: Normal range of motion. He exhibits tenderness.       Right lower leg: He exhibits bony tenderness, swelling and edema.  Mid right tibia  Lymphadenopathy:  No red streaking from wound site  Neurological: He is alert.  Skin: Skin is warm and dry. He is not diaphoretic.  Deep appearing long, linear abrasion mid right anterior tibia with scab present. Modest edema around  wound with tracking to dorsal foot.  Subtle old appearing bruising at medial right heel instep.  Erythema beefy red and limited to 2 cm surrounding abrasion.  No red streaking.  No erythema in foot or ankle.No fluctuance at wound site.  Psychiatric: He has a normal mood and affect.  Nursing note and vitals reviewed.   ED Course  Procedures (including critical care time) Labs Review Labs Reviewed  COMPREHENSIVE METABOLIC PANEL - Abnormal; Notable for the following:    Potassium 3.2 (*)    Glucose, Bld 194 (*)    All other components  within normal limits  LACTIC ACID, PLASMA - Abnormal; Notable for the following:    Lactic Acid, Venous 2.0 (*)    All other components within normal limits  CBC WITH DIFFERENTIAL/PLATELET    Imaging Review Dg Tibia/fibula Right  01/14/2016  CLINICAL DATA:  45 year old male with history of abrasion and redness/swelling over the distal tibia and fibula. EXAM: RIGHT TIBIA AND FIBULA - 2 VIEW COMPARISON:  No priors. FINDINGS: There is no evidence of fracture or other focal bone lesions. Soft tissues are unremarkable. IMPRESSION: Negative. Electronically Signed   By: Vinnie Langton M.D.   On: 01/14/2016 20:40   I have personally reviewed and evaluated these images and lab results as part of my medical decision-making.   EKG Interpretation None      MDM   Final diagnoses:  Skin infection  Hyperglycemia   Patients labs reviewed.  Radiological studies were viewed, interpreted and considered during the medical decision making and disposition process. I agree with radiologists reading.  Results were also discussed with patient.   Pt with cellulitis without abscess at abrasion site right tibia.  No constitutional sx to suggest sepsis. Normal wbc count.  Lactic acid elevated at 2.0 but no evidence of sepsis by exam or hx.  Pt given IV clindamycin here, prescribed same for home.  Elevation, warm compresses of infection site.  F/u with pcp for a recheck in 2 days, returning here sooner for any worsening or new sx which were discussed with pt.  Pt and wife agree with and understand plan.   Discussed elevated blood glucose additionally. Pt states strong fam hx.  He gets a A1C checked yearly for this reason and is currently due.  Advised to tell his MD his cbg today was 194.    Evalee Jefferson, PA-C 01/16/16 1256  Merrily Pew, MD 01/18/16 [phone]

## 2016-01-14 NOTE — Discharge Instructions (Signed)
Cellulitis Cellulitis is an infection of the skin and the tissue beneath it. The infected area is usually red and tender. Cellulitis occurs most often in the arms and lower legs.  CAUSES  Cellulitis is caused by bacteria that enter the skin through cracks or cuts in the skin. The most common types of bacteria that cause cellulitis are staphylococci and streptococci. SIGNS AND SYMPTOMS   Redness and warmth.  Swelling.  Tenderness or pain.  Fever. DIAGNOSIS  Your health care provider can usually determine what is wrong based on a physical exam. Blood tests may also be done. TREATMENT  Treatment usually involves taking an antibiotic medicine. HOME CARE INSTRUCTIONS   Take your antibiotic medicine as directed by your health care provider. Finish the antibiotic even if you start to feel better.  Keep the infected arm or leg elevated to reduce swelling.  Apply a warm cloth to the affected area up to 4 times per day to relieve pain.  Take medicines only as directed by your health care provider.  Keep all follow-up visits as directed by your health care provider. SEEK MEDICAL CARE IF:   You notice red streaks coming from the infected area.  Your red area gets larger or turns dark in color.  Your bone or joint underneath the infected area becomes painful after the skin has healed.  Your infection returns in the same area or another area.  You notice a swollen bump in the infected area.  You develop new symptoms.  You have a fever. SEEK IMMEDIATE MEDICAL CARE IF:   You feel very sleepy.  You develop vomiting or diarrhea.  You have a general ill feeling (malaise) with muscle aches and pains.   This information is not intended to replace advice given to you by your health care provider. Make sure you discuss any questions you have with your health care provider.   Document Released: 04/04/2005 Document Revised: 03/16/2015 Document Reviewed: 09/10/2011 Elsevier Interactive  Patient Education 2016 [place].  Hyperglycemia Hyperglycemia occurs when the glucose (sugar) in your blood is too high. Hyperglycemia can happen for many reasons, but it most often happens to people who do not know they have diabetes or are not managing their diabetes properly.  CAUSES  Whether you have diabetes or not, there are other causes of hyperglycemia. Hyperglycemia can occur when you have diabetes, but it can also occur in other situations that you might not be as aware of, such as: Diabetes  If you have diabetes and are having problems controlling your blood glucose, hyperglycemia could occur because of some of the following reasons:  Not following your meal plan.  Not taking your diabetes medications or not taking it properly.  Exercising less or doing less activity than you normally do.  Being sick. Pre-diabetes  This cannot be ignored. Before people develop Type 2 diabetes, they almost always have "pre-diabetes." This is when your blood glucose levels are higher than normal, but not yet high enough to be diagnosed as diabetes. Research has shown that some long-term damage to the body, especially the heart and circulatory system, may already be occurring during pre-diabetes. If you take action to manage your blood glucose when you have pre-diabetes, you may delay or prevent Type 2 diabetes from developing. Stress  If you have diabetes, you may be "diet" controlled or on oral medications or insulin to control your diabetes. However, you may find that your blood glucose is higher than usual in the hospital whether you have  diabetes or not. This is often referred to as "stress hyperglycemia." Stress can elevate your blood glucose. This happens because of hormones put out by the body during times of stress. If stress has been the cause of your high blood glucose, it can be followed regularly by your caregiver. That way he/she can make sure your hyperglycemia does not continue to  get worse or progress to diabetes. Steroids  Steroids are medications that act on the infection fighting system (immune system) to block inflammation or infection. One side effect can be a rise in blood glucose. Most people can produce enough extra insulin to allow for this rise, but for those who cannot, steroids make blood glucose levels go even higher. It is not unusual for steroid treatments to "uncover" diabetes that is developing. It is not always possible to determine if the hyperglycemia will go away after the steroids are stopped. A special blood test called an A1c is sometimes done to determine if your blood glucose was elevated before the steroids were started. SYMPTOMS  Thirsty.  Frequent urination.  Dry mouth.  Blurred vision.  Tired or fatigue.  Weakness.  Sleepy.  Tingling in feet or leg. DIAGNOSIS  Diagnosis is made by monitoring blood glucose in one or all of the following ways:  A1c test. This is a chemical found in your blood.  Fingerstick blood glucose monitoring.  Laboratory results. TREATMENT  First, knowing the cause of the hyperglycemia is important before the hyperglycemia can be treated. Treatment may include, but is not be limited to:  Education.  Change or adjustment in medications.  Change or adjustment in meal plan.  Treatment for an illness, infection, etc.  More frequent blood glucose monitoring.  Change in exercise plan.  Decreasing or stopping steroids.  Lifestyle changes. HOME CARE INSTRUCTIONS   Test your blood glucose as directed.  Exercise regularly. Your caregiver will give you instructions about exercise. Pre-diabetes or diabetes which comes on with stress is helped by exercising.  Eat wholesome, balanced meals. Eat often and at regular, fixed times. Your caregiver or nutritionist will give you a meal plan to guide your sugar intake.  Being at an ideal weight is important. If needed, losing as little as 10 to 15 pounds may  help improve blood glucose levels. SEEK MEDICAL CARE IF:   You have questions about medicine, activity, or diet.  You continue to have symptoms (problems such as increased thirst, urination, or weight gain). SEEK IMMEDIATE MEDICAL CARE IF:   You are vomiting or have diarrhea.  Your breath smells fruity.  You are breathing faster or slower.  You are very sleepy or incoherent.  You have numbness, tingling, or pain in your feet or hands.  You have chest pain.  Your symptoms get worse even though you have been following your caregiver's orders.  If you have any other questions or concerns.   This information is not intended to replace advice given to you by your health care provider. Make sure you discuss any questions you have with your health care provider.   Document Released: 12/19/2000 Document Revised: 09/17/2011 Document Reviewed: 03/01/2015 Elsevier Interactive Patient Education 2016 [place] your next dose of the antibiotic tomorrow morning.  Elevation and warm compresses may help with pain and swelling.  Your blood glucose level of 194 is very elevated and needs to be rechecked this week when you see your doctor.  You may take motrin or tylenol if needed for discomfort.

## 2016-01-14 NOTE — ED Provider Notes (Signed)
Medical screening examination/treatment/procedure(s) were conducted as a shared visit with non-physician practitioner(s) and myself.  I personally evaluated the patient during the encounter.  45 year old male who injured his leg last week and now has developed worsening redness around it and pain without healing very well secured here for further evaluation. On my examination he has a well-healing wound, however beefy red erythema, tenderness and warmth around it. No obvious fluid collections or fluctuance to suggest abscess. I review his vital signs and there is not any evidence for sepsis. Lactic acid was checked in triage and was 2.0 however I don't think the patient has any evidence of severe sepsis or septic shock to warrant inpatient admission. Given an IV dose of antibiotics here and would benefit from a prescription for antibiotics at home. Will return here for any streaking, fever, nausea, vomiting, worsening erythema after 24 hours or feeling to get better on antibiotics after approximately 3 days. Last fall was primary doctor for further evaluation to ensure improvement.  Merrily Pew, MD 01/15/16 [phone]

## 2017-04-19 DIAGNOSIS — E782 Mixed hyperlipidemia: Secondary | ICD-10-CM | POA: Diagnosis not present

## 2017-04-19 DIAGNOSIS — Z024 Encounter for examination for driving license: Secondary | ICD-10-CM | POA: Diagnosis not present

## 2017-04-19 DIAGNOSIS — D492 Neoplasm of unspecified behavior of bone, soft tissue, and skin: Secondary | ICD-10-CM | POA: Diagnosis not present

## 2017-06-27 DIAGNOSIS — D225 Melanocytic nevi of trunk: Secondary | ICD-10-CM | POA: Diagnosis not present

## 2017-06-27 DIAGNOSIS — L720 Epidermal cyst: Secondary | ICD-10-CM | POA: Diagnosis not present

## 2017-06-27 DIAGNOSIS — D485 Neoplasm of uncertain behavior of skin: Secondary | ICD-10-CM | POA: Diagnosis not present

## 2018-04-04 ENCOUNTER — Emergency Department: Admit: 2018-04-04 | Payer: PRIVATE HEALTH INSURANCE | Primary: Family Medicine

## 2018-04-04 ENCOUNTER — Inpatient Hospital Stay
Admit: 2018-04-04 | Discharge: 2018-04-05 | Disposition: A | Discharge status: Home / Self Care | Payer: PRIVATE HEALTH INSURANCE | Attending: Internal Medicine | Admitting: Internal Medicine

## 2018-04-04 ENCOUNTER — Inpatient Hospital Stay: Primary: Family Medicine

## 2018-04-04 DIAGNOSIS — I214 Non-ST elevation (NSTEMI) myocardial infarction: Principal | ICD-10-CM

## 2018-04-04 LAB — METABOLIC PANEL, COMPREHENSIVE
ALT (SGPT): 29 U/L (ref 12–78)
AST (SGOT): 9 U/L — ABNORMAL LOW (ref 15–37)
Albumin: 4 gm/dl (ref 3.4–5.0)
Alk. phosphatase: 89 U/L (ref 45–117)
Anion gap: 10 mmol/L (ref 5–15)
BUN: 14 mg/dl (ref 7–25)
Bilirubin, total: 0.4 mg/dl (ref 0.2–1.0)
CO2: 22 mEq/L (ref 21–32)
Calcium: 9.1 mg/dl (ref 8.5–10.1)
Chloride: 104 mEq/L (ref 98–107)
Creatinine: 1 mg/dl (ref 0.6–1.3)
GFR est AA: >60
GFR est non-AA: >60
Glucose: 146 mg/dl — ABNORMAL HIGH (ref 74–106)
Potassium: 3.8 mEq/L (ref 3.5–5.1)
Protein, total: 7.7 gm/dl (ref 6.4–8.2)
Sodium: 136 mEq/L (ref 136–145)

## 2018-04-04 LAB — PTT: aPTT: 28.2 seconds (ref 25.1–36.5)

## 2018-04-04 LAB — CBC WITH AUTOMATED DIFF
BASOPHILS: 0.3 % (ref 0–3)
EOSINOPHILS: 1.5 % (ref 0–5)
HCT: 42 % (ref 37.0–50.0)
HGB: 14.6 gm/dl (ref 12.4–17.2)
IMMATURE GRANULOCYTES: 0.6 % (ref 0.0–3.0)
LYMPHOCYTES: 21.9 % — ABNORMAL LOW (ref 28–48)
MCH: 30 pg (ref 23.0–34.6)
MCHC: 34.8 gm/dl (ref 30.0–36.0)
MCV: 86.4 fL (ref 80.0–98.0)
MONOCYTES: 5.7 % (ref 1–13)
MPV: 10.6 fL — ABNORMAL HIGH (ref 6.0–10.0)
NEUTROPHILS: 70 % — ABNORMAL HIGH (ref 34–64)
NRBC: 0 (ref 0–0)
PLATELET: 189 10*3/uL (ref 140–450)
RBC: 4.86 M/uL (ref 3.80–5.70)
RDW-SD: 41.1 (ref 35.1–43.9)
WBC: 13 10*3/uL — ABNORMAL HIGH (ref 4.0–11.0)

## 2018-04-04 LAB — LIPID PANEL
CHOL/HDL Ratio: 7.2 Ratio — ABNORMAL HIGH (ref 0.0–5.0)
Chol/HDL Ratio: 7.2 Ratio — ABNORMAL HIGH (ref 0.0–5.0)
Cholesterol, Total: 195 mg/dl (ref 140–199)
Cholesterol, total: 195 mg/dl (ref 140–199)
HDL Cholesterol: 27 mg/dl — ABNORMAL LOW (ref 40–96)
HDL: 27 mg/dl — ABNORMAL LOW (ref 40–96)
LDL Calculated: 136 mg/dl — ABNORMAL HIGH (ref 0–130)
LDL, calculated: 136 mg/dl — ABNORMAL HIGH (ref 0–130)
Triglyceride: 160 mg/dl — ABNORMAL HIGH (ref 29–150)
Triglycerides: 160 mg/dl — ABNORMAL HIGH (ref 29–150)

## 2018-04-04 LAB — TROPONIN I
Troponin-I: <0.015 ng/ml (ref 0.000–0.045)
Troponin-I: 0.109 ng/ml — ABNORMAL HIGH (ref 0.000–0.045)

## 2018-04-04 LAB — CREATININE, POC: Creatinine: 0.9 mg/dl (ref 0.6–1.3)

## 2018-04-04 LAB — POC ACTIVATED CLOTTING TIME
ACTIVATED CLOTTING TIME: 138 seconds (ref 79–149)
ACTIVATED CLOTTING TIME: 227 seconds — ABNORMAL HIGH (ref 79–149)
Activated clotting time (POC): 138 seconds (ref 79–149)
Activated clotting time (POC): 227 seconds — ABNORMAL HIGH (ref 79–149)

## 2018-04-04 LAB — TSH 3RD GENERATION
TSH: 1.22 u[IU]/mL (ref 0.358–3.740)
TSH: 1.22 u[IU]/mL (ref 0.358–3.740)

## 2018-04-04 LAB — MAGNESIUM
Magnesium: 1.5 mg/dl — ABNORMAL LOW (ref 1.6–2.6)
Magnesium: 1.5 mg/dl — ABNORMAL LOW (ref 1.6–2.6)

## 2018-04-04 LAB — COMPREHENSIVE METABOLIC PANEL
ALT: 29 U/L (ref 12–78)
AST: 9 U/L — ABNORMAL LOW (ref 15–37)
Albumin: 4 gm/dl (ref 3.4–5.0)
Alkaline Phosphatase: 89 U/L (ref 45–117)
Anion Gap: 10 mmol/L (ref 5–15)
BUN: 14 mg/dl (ref 7–25)
CO2: 22 mEq/L (ref 21–32)
Calcium: 9.1 mg/dl (ref 8.5–10.1)
Chloride: 104 mEq/L (ref 98–107)
Creatinine: 1 mg/dl (ref 0.6–1.3)
GFR African American: >60
Glucose: 146 mg/dl — ABNORMAL HIGH (ref 74–106)
Potassium: 3.8 mEq/L (ref 3.5–5.1)
Sodium: 136 mEq/L (ref 136–145)
Total Bilirubin: 0.4 mg/dl (ref 0.2–1.0)
Total Protein: 7.7 gm/dl (ref 6.4–8.2)
eGFR NON-AA: >60

## 2018-04-04 LAB — CBC WITH AUTO DIFFERENTIAL
Basophils %: 0.3 % (ref 0–3)
Eosinophils %: 1.5 % (ref 0–5)
Hematocrit: 42 % (ref 37.0–50.0)
Hemoglobin: 14.6 gm/dl (ref 12.4–17.2)
Immature Granulocytes %: 0.6 % (ref 0.0–3.0)
Lymphocytes %: 21.9 % — ABNORMAL LOW (ref 28–48)
MCH: 30 pg (ref 23.0–34.6)
MCHC: 34.8 gm/dl (ref 30.0–36.0)
MCV: 86.4 fL (ref 80.0–98.0)
MPV: 10.6 fL — ABNORMAL HIGH (ref 6.0–10.0)
Monocytes %: 5.7 % (ref 1–13)
Neutrophils %: 70 % — ABNORMAL HIGH (ref 34–64)
Nucleated RBCs: 0 (ref 0–0)
Platelets: 189 10*3/uL (ref 140–450)
RBC: 4.86 M/uL (ref 3.80–5.70)
RDW-SD: 41.1 (ref 35.1–43.9)
WBC: 13 10*3/uL — ABNORMAL HIGH (ref 4.0–11.0)

## 2018-04-04 LAB — AMB POC CREATININE: Creatinine: 0.9 mg/dl (ref 0.6–1.3)

## 2018-04-04 LAB — TROPONIN
Troponin I: <0.015 ng/ml (ref 0.000–0.045)
Troponin I: 0.109 ng/ml — ABNORMAL HIGH (ref 0.000–0.045)

## 2018-04-04 LAB — APTT: aPTT: 28.2 seconds (ref 25.1–36.5)

## 2018-04-04 MED ORDER — HEPARIN (PORCINE) 1,000 UNIT/ML IJ SOLN
1000 unit/mL | INTRAMUSCULAR | Status: DC | PRN
Start: 2018-04-04 — End: 2018-04-04
  Administered 2018-04-04 (×2): via INTRAVENOUS

## 2018-04-04 MED ORDER — TICAGRELOR 90 MG TAB
90 mg | Freq: Two times a day (BID) | ORAL | Status: DC
Start: 2018-04-04 — End: 2018-04-05
  Administered 2018-04-05: 14:00:00 via ORAL

## 2018-04-04 MED ORDER — EPTIFIBATIDE 2 MG/ML IV SOLN
2 mg/mL | INTRAVENOUS | Status: DC | PRN
Start: 2018-04-04 — End: 2018-04-04
  Administered 2018-04-04 (×2): via INTRAVENOUS

## 2018-04-04 MED ORDER — PANTOPRAZOLE 40 MG TAB, DELAYED RELEASE
40 mg | ORAL | Status: AC
Start: 2018-04-04 — End: 2018-04-04
  Administered 2018-04-04: 15:00:00 via ORAL

## 2018-04-04 MED ORDER — REMOVE PATCH (GENERIC)
Status: DC
Start: 2018-04-04 — End: 2018-04-05

## 2018-04-04 MED ORDER — NITROGLYCERIN 0.4 MG SUBLINGUAL TAB
0.4 mg | SUBLINGUAL | Status: AC
Start: 2018-04-04 — End: 2018-04-04
  Administered 2018-04-04: 17:00:00 via SUBLINGUAL

## 2018-04-04 MED ORDER — LIDOCAINE 2 % MUCOSAL SOLN
2 % | Status: AC
Start: 2018-04-04 — End: 2018-04-04
  Administered 2018-04-04: 14:00:00 via OROMUCOSAL

## 2018-04-04 MED ORDER — MAGNESIUM SULFATE 2 GRAM/50 ML IVPB
2 gram/50 mL (4 %) | INTRAVENOUS | Status: AC
Start: 2018-04-04 — End: 2018-04-04
  Administered 2018-04-04: 14:00:00 via INTRAVENOUS

## 2018-04-04 MED ORDER — SODIUM CHLORIDE 0.9 % IV
INTRAVENOUS | Status: DC
Start: 2018-04-04 — End: 2018-04-04
  Administered 2018-04-04: 19:00:00 via INTRAVENOUS

## 2018-04-04 MED ORDER — HEPARIN (PORCINE) 1,000 UNIT/ML IJ SOLN
1000 unit/mL | INTRAMUSCULAR | Status: DC | PRN
Start: 2018-04-04 — End: 2018-04-04
  Administered 2018-04-04: 17:00:00 via INTRAVENOUS

## 2018-04-04 MED ORDER — OXYCODONE-ACETAMINOPHEN 5 MG-325 MG TAB
5-325 mg | ORAL | Status: DC | PRN
Start: 2018-04-04 — End: 2018-04-05

## 2018-04-04 MED ORDER — HYDROMORPHONE 1 MG/ML INJECTION SOLUTION
1 mg/mL | INTRAMUSCULAR | Status: DC | PRN
Start: 2018-04-04 — End: 2018-04-04

## 2018-04-04 MED ORDER — ONDANSETRON 4 MG TAB, RAPID DISSOLVE
4 mg | Freq: Four times a day (QID) | ORAL | Status: DC | PRN
Start: 2018-04-04 — End: 2018-04-05

## 2018-04-04 MED ORDER — FENTANYL CITRATE (PF) 50 MCG/ML IJ SOLN
50 mcg/mL | INTRAMUSCULAR | Status: DC | PRN
Start: 2018-04-04 — End: 2018-04-04
  Administered 2018-04-04 (×2): via INTRAVENOUS

## 2018-04-04 MED ORDER — FLU VACCINE QV 2019-20 (4 YRS+)(PF) 60 MCG (15 MCG X 4)/0.5 ML IM SYRINGE
60 mcg (15 mcg x 4)/0.5 mL | INTRAMUSCULAR | Status: DC
Start: 2018-04-04 — End: 2018-04-05

## 2018-04-04 MED ORDER — VERAPAMIL 2.5 MG/ML IV
2.5 mg/mL | INTRAVENOUS | Status: DC | PRN
Start: 2018-04-04 — End: 2018-04-04
  Administered 2018-04-04: 19:00:00 via INTRA_ARTERIAL

## 2018-04-04 MED ORDER — HEPARIN (PORCINE) 1,000 UNIT/ML IJ SOLN
1000 unit/mL | INTRAMUSCULAR | Status: DC | PRN
Start: 2018-04-04 — End: 2018-04-04

## 2018-04-04 MED ORDER — SODIUM CHLORIDE 0.9% BOLUS IV
0.9 % | Freq: Once | INTRAVENOUS | Status: AC
Start: 2018-04-04 — End: 2018-04-04
  Administered 2018-04-04: 17:00:00 via INTRAVENOUS

## 2018-04-04 MED ORDER — HYDRALAZINE 20 MG/ML IJ SOLN
20 mg/mL | INTRAMUSCULAR | Status: DC | PRN
Start: 2018-04-04 — End: 2018-04-05

## 2018-04-04 MED ORDER — HEPARIN (PORCINE)-0.45% NACL 25,000 UNIT/500 ML IV
25000 unit/500 mL | INTRAVENOUS | Status: AC
Start: 2018-04-04 — End: 2018-04-04
  Administered 2018-04-04: 17:00:00 via INTRAVENOUS

## 2018-04-04 MED ORDER — NITROGLYCERIN 0.5 MG/ 10 ML (50 MCG/ML COMPOUNDED INJECTION)
0.5 mg/ 10mL | INTRAVENOUS | Status: DC | PRN
Start: 2018-04-04 — End: 2018-04-04
  Administered 2018-04-04 (×2): via INTRA_ARTERIAL

## 2018-04-04 MED ORDER — TEMAZEPAM 7.5 MG CAP
7.5 mg | Freq: Every evening | ORAL | Status: DC | PRN
Start: 2018-04-04 — End: 2018-04-05

## 2018-04-04 MED ORDER — HEPARIN (PORCINE)-0.45% NACL 25,000 UNIT/500 ML IV
25000 unit/500 mL | INTRAVENOUS | Status: DC
Start: 2018-04-04 — End: 2018-04-04
  Administered 2018-04-04: 18:00:00 via INTRAVENOUS

## 2018-04-04 MED ORDER — SODIUM CHLORIDE 0.9 % IJ SYRG
Freq: Three times a day (TID) | INTRAMUSCULAR | Status: DC
Start: 2018-04-04 — End: 2018-04-04
  Administered 2018-04-04: 18:00:00 via INTRAVENOUS

## 2018-04-04 MED ORDER — ACETAMINOPHEN 325 MG TABLET
325 mg | ORAL | Status: DC | PRN
Start: 2018-04-04 — End: 2018-04-05

## 2018-04-04 MED ORDER — IOPAMIDOL 61 % IV SOLN
300 mg iodine /mL (61 %) | INTRAVENOUS | Status: DC | PRN
Start: 2018-04-04 — End: 2018-04-04
  Administered 2018-04-04: 20:00:00 via INTRA_ARTERIAL

## 2018-04-04 MED ORDER — SODIUM CHLORIDE 0.9 % IJ SYRG
Freq: Once | INTRAMUSCULAR | Status: AC
Start: 2018-04-04 — End: 2018-04-04
  Administered 2018-04-04: 12:00:00 via INTRAVENOUS

## 2018-04-04 MED ORDER — EPTIFIBATIDE 0.75 MG/ML IV SOLN
0.75 mg/mL | INTRAVENOUS | Status: AC
Start: 2018-04-04 — End: 2018-04-05
  Administered 2018-04-04 – 2018-04-05 (×4): via INTRAVENOUS

## 2018-04-04 MED ORDER — ONDANSETRON (PF) 4 MG/2 ML INJECTION
4 mg/2 mL | INTRAMUSCULAR | Status: DC | PRN
Start: 2018-04-04 — End: 2018-04-05

## 2018-04-04 MED ORDER — HEPARIN (PORCINE) IN NS (PF) 1,000 UNIT/500 ML IV
1000 unit/500 mL | Freq: Once | INTRAVENOUS | Status: AC
Start: 2018-04-04 — End: 2018-04-04
  Administered 2018-04-04: 19:00:00

## 2018-04-04 MED ORDER — HYDROMORPHONE 1 MG/ML INJECTION SOLUTION
1 mg/mL | INTRAMUSCULAR | Status: AC
Start: 2018-04-04 — End: 2018-04-04
  Administered 2018-04-04: 12:00:00 via INTRAVENOUS

## 2018-04-04 MED ORDER — HEPARIN (PORCINE) 1,000 UNIT/ML IJ SOLN
1000 unit/mL | Freq: Once | INTRAMUSCULAR | Status: AC
Start: 2018-04-04 — End: 2018-04-04
  Administered 2018-04-04: 17:00:00 via INTRAVENOUS

## 2018-04-04 MED ORDER — MIDAZOLAM 1 MG/ML IJ SOLN
1 mg/mL | INTRAMUSCULAR | Status: DC | PRN
Start: 2018-04-04 — End: 2018-04-04
  Administered 2018-04-04 (×2): via INTRAVENOUS

## 2018-04-04 MED ORDER — LOSARTAN 25 MG TAB
25 mg | Freq: Every day | ORAL | Status: DC
Start: 2018-04-04 — End: 2018-04-05
  Administered 2018-04-05: 14:00:00 via ORAL

## 2018-04-04 MED ORDER — NICOTINE 14 MG/24 HR DAILY PATCH
14 mg/24 hr | Freq: Every day | TRANSDERMAL | Status: DC
Start: 2018-04-04 — End: 2018-04-05

## 2018-04-04 MED ORDER — SODIUM CHLORIDE 0.9 % IJ SYRG
INTRAMUSCULAR | Status: DC | PRN
Start: 2018-04-04 — End: 2018-04-05

## 2018-04-04 MED ORDER — TICAGRELOR 90 MG TAB
90 mg | Freq: Once | ORAL | Status: AC | PRN
Start: 2018-04-04 — End: 2018-04-04
  Administered 2018-04-04: 20:00:00 via ORAL

## 2018-04-04 MED ORDER — ALUM-MAG HYDROXIDE-SIMETH 400 MG-400 MG-40 MG/5 ML ORAL SUSP
400-400-40 mg/5 mL | ORAL | Status: AC
Start: 2018-04-04 — End: 2018-04-04
  Administered 2018-04-04: 13:00:00 via ORAL

## 2018-04-04 MED ORDER — IOPAMIDOL 76 % IV SOLN
370 mg iodine /mL (76 %) | Freq: Once | INTRAVENOUS | Status: AC
Start: 2018-04-04 — End: 2018-04-04
  Administered 2018-04-04: 12:00:00 via INTRAVENOUS

## 2018-04-04 MED ORDER — ATORVASTATIN 40 MG TAB
40 mg | Freq: Every evening | ORAL | Status: DC
Start: 2018-04-04 — End: 2018-04-05
  Administered 2018-04-05: 01:00:00 via ORAL

## 2018-04-04 MED ORDER — METOPROLOL TARTRATE 25 MG TAB
25 mg | Freq: Two times a day (BID) | ORAL | Status: DC
Start: 2018-04-04 — End: 2018-04-05
  Administered 2018-04-05 (×2): via ORAL

## 2018-04-04 MED ORDER — SODIUM CHLORIDE 0.9 % IJ SYRG
INTRAMUSCULAR | Status: DC | PRN
Start: 2018-04-04 — End: 2018-04-04

## 2018-04-04 MED ORDER — ASPIRIN 81 MG CHEWABLE TAB
81 mg | Freq: Every day | ORAL | Status: DC
Start: 2018-04-04 — End: 2018-04-05
  Administered 2018-04-05: 14:00:00 via ORAL

## 2018-04-04 MED ORDER — LABETALOL 5 MG/ML IV SOLN
5 mg/mL | INTRAVENOUS | Status: DC | PRN
Start: 2018-04-04 — End: 2018-04-05

## 2018-04-04 MED ORDER — SODIUM CHLORIDE 0.9 % IJ SYRG
Freq: Three times a day (TID) | INTRAMUSCULAR | Status: DC
Start: 2018-04-04 — End: 2018-04-05
  Administered 2018-04-04 – 2018-04-05 (×3): via INTRAVENOUS

## 2018-04-04 MED ORDER — NALOXONE 0.4 MG/ML INJECTION
0.4 mg/mL | INTRAMUSCULAR | Status: DC | PRN
Start: 2018-04-04 — End: 2018-04-05

## 2018-04-04 MED ORDER — MORPHINE 2 MG/ML INJECTION
2 mg/mL | INTRAMUSCULAR | Status: DC | PRN
Start: 2018-04-04 — End: 2018-04-05
  Administered 2018-04-04: 18:00:00 via INTRAVENOUS

## 2018-04-04 MED ORDER — MORPHINE 2 MG/ML INJECTION
2 mg/mL | INTRAMUSCULAR | Status: DC | PRN
Start: 2018-04-04 — End: 2018-04-04

## 2018-04-04 MED ORDER — ASPIRIN 81 MG CHEWABLE TAB
81 mg | Freq: Every day | ORAL | Status: DC
Start: 2018-04-04 — End: 2018-04-04

## 2018-04-04 MED ORDER — SODIUM CHLORIDE 0.9% BOLUS IV
0.9 % | INTRAVENOUS | Status: AC
Start: 2018-04-04 — End: 2018-04-04
  Administered 2018-04-04: 13:00:00 via INTRAVENOUS

## 2018-04-04 MED FILL — HEPARIN (PORCINE)-0.45% NACL 25,000 UNIT/500 ML IV: 25000 unit/500 mL | INTRAVENOUS | Qty: 500

## 2018-04-04 MED FILL — NITROGLYCERIN 0.4 MG SUBLINGUAL TAB: 0.4 mg | SUBLINGUAL | Qty: 1

## 2018-04-04 MED FILL — ISOVUE-370  76 % INTRAVENOUS SOLUTION: 370 mg iodine /mL (76 %) | INTRAVENOUS | Qty: 80

## 2018-04-04 MED FILL — ISOVUE-300  61 % INTRAVENOUS SOLUTION: 300 mg iodine /mL (61 %) | INTRAVENOUS | Qty: 200

## 2018-04-04 MED FILL — PANTOPRAZOLE 40 MG TAB, DELAYED RELEASE: 40 mg | ORAL | Qty: 1

## 2018-04-04 MED FILL — MIDAZOLAM 1 MG/ML IJ SOLN: 1 mg/mL | INTRAMUSCULAR | Qty: 2

## 2018-04-04 MED FILL — FENTANYL CITRATE (PF) 50 MCG/ML IJ SOLN: 50 mcg/mL | INTRAMUSCULAR | Qty: 2

## 2018-04-04 MED FILL — LIDOCAINE VISCOUS 2 % MUCOSAL SOLUTION: 2 % | Qty: 15

## 2018-04-04 MED FILL — MORPHINE 2 MG/ML INJECTION: 2 mg/mL | INTRAMUSCULAR | Qty: 1

## 2018-04-04 MED FILL — HYDROMORPHONE 1 MG/ML INJECTION SOLUTION: 1 mg/mL | INTRAMUSCULAR | Qty: 1

## 2018-04-04 MED FILL — SODIUM CHLORIDE 0.9 % IV: INTRAVENOUS | Qty: 1000

## 2018-04-04 MED FILL — MAGNESIUM SULFATE 2 GRAM/50 ML IVPB: 2 gram/50 mL (4 %) | INTRAVENOUS | Qty: 50

## 2018-04-04 MED FILL — NITROGLYCERIN 0.5 MG/ 10 ML (50 MCG/ML COMPOUNDED INJECTION): 0.5 mg/ 10mL | INTRAVENOUS | Qty: 20

## 2018-04-04 MED FILL — HEPARIN (PORCINE) 1,000 UNIT/ML IJ SOLN: 1000 unit/mL | INTRAMUSCULAR | Qty: 10

## 2018-04-04 MED FILL — MAG-AL PLUS EXTRA STRENGTH 400 MG-400 MG-40 MG/5 ML ORAL SUSPENSION: 400-400-40 mg/5 mL | ORAL | Qty: 30

## 2018-04-04 MED FILL — LABETALOL 5 MG/ML IV SOLN: 5 mg/mL | INTRAVENOUS | Qty: 20

## 2018-04-04 MED FILL — BRILINTA 90 MG TABLET: 90 mg | ORAL | Qty: 2

## 2018-04-04 MED FILL — VERAPAMIL 2.5 MG/ML IV: 2.5 mg/mL | INTRAVENOUS | Qty: 2

## 2018-04-04 MED FILL — EPTIFIBATIDE 2 MG/ML IV SOLN: 2 mg/mL | INTRAVENOUS | Qty: 10

## 2018-04-04 NOTE — ED Provider Notes (Signed)
[facility]  Emergency Department Treatment Report    Patient: David Heath Age: 47 y.o. Sex: male    Date of Birth: Feb 08, 1971 Admit Date: 04/04/2018 PCP: Lacey Jensen, MD   MRN: [id number]  CSN: [id number]     Room: ER30/ER30 Time Dictated: 8:04 AM        Chief Complaint   Chest pain  History of Present Illness   47 y.o. male with history of hypertension and reflux.  He presents with mid sternal chest pain.  Describes it as severe and with sudden onset beginning while at work early this morning.  States that he was at his baseline health prior to that.  No recent illness or injury.  He denies fever, cough, shortness of breath, edema, abdominal pain, back pain.  Patient received aspirin and nitroglycerin by EMS.  He received dose of atropine as well secondary to bradycardia.  Reportedly was normotensive throughout.    Review of Systems   Review of Systems   Constitutional: Negative for chills and fever.   HENT: Negative for sore throat.    Respiratory: Negative for cough and shortness of breath.    Cardiovascular: Positive for chest pain. Negative for leg swelling.   Gastrointestinal: Negative for abdominal pain, nausea and vomiting.   Genitourinary: Negative for dysuria and flank pain.   Musculoskeletal: Negative for joint pain and myalgias.   Skin: Negative for itching and rash.   Neurological: Negative for dizziness, focal weakness and headaches.   Psychiatric/Behavioral: Negative for depression. The patient is not nervous/anxious.        Past Medical/Surgical History     Past Medical History:   Diagnosis Date   ??? Gastrointestinal disorder      No past surgical history on file.    Social History     Social History     Socioeconomic History   ??? Marital status: MARRIED     Spouse name: Not on file   ??? Number of children: Not on file   ??? Years of education: Not on file   ??? Highest education level: Not on file   Tobacco Use   ??? Smoking status: Current Every Day Smoker     Packs/day: 1.50      Years: 30.00     Pack years: 45.00   ??? Smokeless tobacco: Never Used   Substance and Sexual Activity   ??? Alcohol use: Not Currently   ??? Drug use: Never       Family History   No family history on file.    Current Medications     Current Facility-Administered Medications   Medication Dose Route Frequency Provider Last Rate Last Dose   ??? magnesium sulfate 2 g/50 ml IVPB (premix or compounded)  2 g IntraVENous NOW Lindajo Royal, MD 50 mL/hr at 04/04/18 0936 2 g at 04/04/18 3428     Current Outpatient Medications   Medication Sig Dispense Refill   ??? amLODIPine-atorvastatin (CADUET) 10-10 mg per tablet Take 1 Tab by mouth daily. Indications: high blood pressure         Allergies   No Known Allergies    Physical Exam     ED Triage Vitals [04/04/18 0756]   ED Encounter Vitals Group      BP 116/77      Pulse (Heart Rate) (!) 55      Resp Rate 15      Temp       Temp src  O2 Sat (%) 98 %      Weight 214 lb      Height 6'     Physical Exam   Constitutional: He is oriented to person, place, and time. He appears distressed (Patient appears in pain with frequent moaning and grunting).   HENT:   Head: Normocephalic and atraumatic.   Cardiovascular: Regular rhythm, normal heart sounds and intact distal pulses. Bradycardia present.   Pulmonary/Chest: Effort normal and breath sounds normal. No respiratory distress.   Abdominal: Soft. Bowel sounds are normal. There is no tenderness.   Musculoskeletal: He exhibits no edema.   Neurological: He is alert and oriented to person, place, and time. He exhibits normal muscle tone.   Skin: Skin is warm and dry.   Psychiatric: Mood and affect normal.   Nursing note and vitals reviewed.      Impression and Management Plan   Acute onset chest pain appears severe in nature with patient frequent grunting.  Concern for possible dissection.  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  Mild  bradycardia.  Will treat pain with Dilaudid.  Check EKG, CBC, chemistry, troponin.  CTA chest and abdomen.    Diagnostic Studies   Lab:   Recent Results (from the past 12 hour(s))   CBC WITH AUTOMATED DIFF    Collection Time: 04/04/18  8:04 AM   Result Value Ref Range    WBC 13.0 (H) 4.0 - 11.0 1000/mm3    RBC 4.86 3.80 - 5.70 M/uL    HGB 14.6 12.4 - 17.2 gm/dl    HCT 42.0 37.0 - 50.0 %    MCV 86.4 80.0 - 98.0 fL    MCH 30.0 23.0 - 34.6 pg    MCHC 34.8 30.0 - 36.0 gm/dl    PLATELET 189 140 - 450 1000/mm3    MPV 10.6 (H) 6.0 - 10.0 fL    RDW-SD 41.1 35.1 - 43.9      NRBC 0 0 - 0      IMMATURE GRANULOCYTES 0.6 0.0 - 3.0 %    NEUTROPHILS 70.0 (H) 34 - 64 %    LYMPHOCYTES 21.9 (L) 28 - 48 %    MONOCYTES 5.7 1 - 13 %    EOSINOPHILS 1.5 0 - 5 %    BASOPHILS 0.3 0 - 3 %   METABOLIC PANEL, COMPREHENSIVE    Collection Time: 04/04/18  8:04 AM   Result Value Ref Range    Sodium 136 136 - 145 mEq/L    Potassium 3.8 3.5 - 5.1 mEq/L    Chloride 104 98 - 107 mEq/L    CO2 22 21 - 32 mEq/L    Glucose 146 (H) 74 - 106 mg/dl    BUN 14 7 - 25 mg/dl    Creatinine 1.0 0.6 - 1.3 mg/dl    GFR est AA >60      GFR est non-AA >60      Calcium 9.1 8.5 - 10.1 mg/dl    AST (SGOT) 9 (L) 15 - 37 U/L    ALT (SGPT) 29 12 - 78 U/L    Alk. phosphatase 89 45 - 117 U/L    Bilirubin, total 0.4 0.2 - 1.0 mg/dl    Protein, total 7.7 6.4 - 8.2 gm/dl    Albumin 4.0 3.4 - 5.0 gm/dl    Anion gap 10 5 - 15 mmol/L   TROPONIN I    Collection Time: 04/04/18  8:04 AM  Result Value Ref Range    Troponin-I <0.015 0.000 - 0.045 ng/ml   MAGNESIUM    Collection Time: 04/04/18  8:04 AM   Result Value Ref Range    Magnesium 1.5 (L) 1.6 - 2.6 mg/dl   POC CREATININE    Collection Time: 04/04/18  8:04 AM   Result Value Ref Range    Creatinine 0.9 0.6 - 1.3 mg/dl       Imaging:    Cta Chest W Or W Wo Cont    Result Date: 04/04/2018  INDICATION: Pain. COMPARISON: None available. TECHNIQUE: CTA chest, abdomen and pelvis with and without intravenous contrast. DICOM format  image data is available to non-affiliated external healthcare facilities or entities on a secure, media free, reciprocally searchable basis with patient authorization for 12 months following the date of the study. Findings: CHEST: No obvious filling defects within the pulmonary arteries. The heart is normal in size. No aneurysmal dilatation, dissection or intramural hematoma. Subtle calcification involving the aortic arch. The heart is probably within normal limits. Trace pericardial effusion is noted dependently and anteriorly. No lymphadenopathy. A 4 mm left major fissure associated nodule (image 22 on series 7). No additional dominant nodules. ABDOMEN/PELVIS: Normal enhancement of the aorta and major vessels arising from the aorta. No significant atherosclerotic disease. No aneurysm or dissection. Liver, gallbladder, spleen, pancreas, and adrenal glands grossly unremarkable. An indeterminate 7-8 mm enhancing lesion arising from the posterior inferior right renal cortex (image 125 on series 6 and image 62 on series 9). Left renal cysts, measuring up to 3.7 cm. Ascending and transverse colon are decompressed. No definite inflammatory change. A normal appendix. No retroperitoneal lymphadenopathy. A few scattered mesenteric lymph nodes, which are increased in number. Small fat-containing umbilical hernia.     Impression: 1. No acute abnormalities. 2. A 4 mm nodule associated with the left major fissure. Follow-up per Fleischner criteria. ABDOMEN/PELVIS: 1. No acute abnormalities. 2. An indeterminate 7 mm enhancing right renal cortical lesion. This may reflect a benign or malignant lesion. Continued surveillance is recommended. MRI with and without contrast may be obtained for better evaluation. 3. Left renal cysts. 4. Small fat-containing umbilical hernia. 5. A few scattered mesenteric lymph nodes, which are slightly increased in number. Exact etiology is unknown. Findings may  reflect mesenteric adenitis. SOLID NODULES Nodule Size:  < 6 mm Low-Risk Patient:  No CT followup needed. High-Risk Patient: Optional CT follow-up at 12 months Low-Risk Patient with multiple nodules: No CT follow-up needed. High-Risk Patient with multiple nodules: Optional CT follow-up at 12 months. Nodule Size: 6-8 mm Low-Risk Patient: CT at 6-12 months, then consider CT at 18-24 months. High-Risk Patient:  Initial followup at 6-12 months and then at 18-24 months if no change. Low-Risk Patient with multiple nodules: CT at 3-6 months, then consider CT at 18-24 months if no change. High-Risk Patient with multiple nodules: CT at 3-6 months, then at 18-24 months if no change. Nodule Size:  >8 mm Low-Risk Patient: Consider CT, PET/CT, or tissue sampling at 3 months. High-Risk Patient:  Consider CT, PET/CT, or tissue sampling at 3 months. Low-Risk Patient with multiple nodules: CT at 3-6 months, then consider CT at 18-24 months if no change. High-Risk Patient with multiple nodules: CT at 3-6 months, then at 18-24 months if no change. SUBSOLID NODULES: Nodule Size:  < 6 mm Groundglass: No CT followup needed. Part solid: No CT followup needed. Multiple nodules: CT at 3-6 months. If stable, consider CT at 2 and 4 years. Nodule  size > 6 mm Groundglass: CT at 6-12 months to confirm presence. Then CT every 2 years until 5 years. Part solid: CT at 3-6 months to confirm presence. If unchanged and solid component remains less than 6 mm, annual CT should be performed for 5 years. Multiple nodules: CT at 3-6 months. Subsequent management based on the most suspicious nodule.     Cta Abd Pelv W Wo Cont    Result Date: 04/04/2018  INDICATION: Pain. COMPARISON: None available. TECHNIQUE: CTA chest, abdomen and pelvis with and without intravenous contrast. DICOM format image data is available to non-affiliated external healthcare facilities or entities on a secure, media free, reciprocally searchable basis with  patient authorization for 12 months following the date of the study. Findings: CHEST: No obvious filling defects within the pulmonary arteries. The heart is normal in size. No aneurysmal dilatation, dissection or intramural hematoma. Subtle calcification involving the aortic arch. The heart is probably within normal limits. Trace pericardial effusion is noted dependently and anteriorly. No lymphadenopathy. A 4 mm left major fissure associated nodule (image 22 on series 7). No additional dominant nodules. ABDOMEN/PELVIS: Normal enhancement of the aorta and major vessels arising from the aorta. No significant atherosclerotic disease. No aneurysm or dissection. Liver, gallbladder, spleen, pancreas, and adrenal glands grossly unremarkable. An indeterminate 7-8 mm enhancing lesion arising from the posterior inferior right renal cortex (image 125 on series 6 and image 62 on series 9). Left renal cysts, measuring up to 3.7 cm. Ascending and transverse colon are decompressed. No definite inflammatory change. A normal appendix. No retroperitoneal lymphadenopathy. A few scattered mesenteric lymph nodes, which are increased in number. Small fat-containing umbilical hernia.     Impression: 1. No acute abnormalities. 2. A 4 mm nodule associated with the left major fissure. Follow-up per Fleischner criteria. ABDOMEN/PELVIS: 1. No acute abnormalities. 2. An indeterminate 7 mm enhancing right renal cortical lesion. This may reflect a benign or malignant lesion. Continued surveillance is recommended. MRI with and without contrast may be obtained for better evaluation. 3. Left renal cysts. 4. Small fat-containing umbilical hernia. 5. A few scattered mesenteric lymph nodes, which are slightly increased in number. Exact etiology is unknown. Findings may reflect mesenteric adenitis. SOLID NODULES Nodule Size:  < 6 mm Low-Risk Patient:  No CT followup needed. High-Risk Patient: Optional CT follow-up  at 12 months Low-Risk Patient with multiple nodules: No CT follow-up needed. High-Risk Patient with multiple nodules: Optional CT follow-up at 12 months. Nodule Size: 6-8 mm Low-Risk Patient: CT at 6-12 months, then consider CT at 18-24 months. High-Risk Patient:  Initial followup at 6-12 months and then at 18-24 months if no change. Low-Risk Patient with multiple nodules: CT at 3-6 months, then consider CT at 18-24 months if no change. High-Risk Patient with multiple nodules: CT at 3-6 months, then at 18-24 months if no change. Nodule Size:  >8 mm Low-Risk Patient: Consider CT, PET/CT, or tissue sampling at 3 months. High-Risk Patient:  Consider CT, PET/CT, or tissue sampling at 3 months. Low-Risk Patient with multiple nodules: CT at 3-6 months, then consider CT at 18-24 months if no change. High-Risk Patient with multiple nodules: CT at 3-6 months, then at 18-24 months if no change. SUBSOLID NODULES: Nodule Size:  < 6 mm Groundglass: No CT followup needed. Part solid: No CT followup needed. Multiple nodules: CT at 3-6 months. If stable, consider CT at 2 and 4 years. Nodule size > 6 mm Groundglass: CT at 6-12 months to confirm presence. Then CT every  2 years until 5 years. Part solid: CT at 3-6 months to confirm presence. If unchanged and solid component remains less than 6 mm, annual CT should be performed for 5 years. Multiple nodules: CT at 3-6 months. Subsequent management based on the most suspicious nodule.       EKG???sinus bradycardia, rate 55 bpm.  Normal axis.  Normal intervals.  Nonspecific ST abnormality.  Ysidro Evert.  Medical Decision Making/ED Course   Patient has remained medically stable through emergency department evaluation and observation.  He is improved with symptomatic medications.  CTA of chest abdomen pelvis shows no indication of aortic dissection or aneurysm.  No evidence of pulmonary embolus.  No urgent findings noted.  EKG is nonspecific.  Initial troponin is normal.  Patient has had mild  bradycardia with a normal blood pressure.  Magnesium is mildly low and is repleted in emergency department.  He was treated with aspirin and nitroglycerin by EMS.  Also received pain medicine and GI cocktail in the ED.  At this point will place in emergency department observation for serial troponins and provocative testing.  If negative will treat symptoms as GI.  Patient does have right renal cortical lesion that will require further evaluation as an outpatient.  I have discussed this with him.  He voices understanding.    Final Diagnosis       ICD-10-CM ICD-9-CM   1. Chest pain, unspecified type R07.9 786.50   2. Hypomagnesemia E83.42 275.2   3. Renal lesion N28.9 593.9     Disposition   ED observation    Lindajo Royal, M.D.  April 04, 2018    *Portions of this electronic record were dictated using Systems analyst.  Unintended errors in translation may occur.    My signature above authenticates this document and my orders, the final ??  diagnosis (es), discharge prescription (s), and instructions in the Epic ??  record.  If you have any questions please contact [phone].  ??  Nursing notes have been reviewed by the physician/ advanced practice ??  Clinician.

## 2018-04-04 NOTE — ED Notes (Signed)
IV fluids complete. Pt stated he received relief with medication given. See MAR. Pt resting in bed with eyes closed. Call bell within reach.

## 2018-04-04 NOTE — ED Notes (Signed)
Mag Sulfate complete. Alcohol cap placed. Pt remains asleep in bed with eyes closed. Provided Pt with blanket. Call bell within reach.

## 2018-04-04 NOTE — Other (Signed)
TRANSFER - OUT REPORT:    Verbal report given to Tammy (name) on David Heath  being transferred to 2220**(unit) for routine progression of care       Report consisted of patient???s Situation, Background, Assessment and   Recommendations(SBAR).     Information from the following report(s) SBAR ED Summary [facility] Recent results was reviewed with the receiving nurse.    Lines:   Peripheral IV 04/04/18 Right Antecubital (Active)   Site Assessment Clean, dry, & intact 04/04/2018  8:02 AM   Phlebitis Assessment 0 04/04/2018  8:02 AM   Infiltration Assessment 0 04/04/2018  8:02 AM   Dressing Status Clean, dry, & intact 04/04/2018  8:02 AM   Dressing Type Transparent 04/04/2018  8:02 AM   Action Taken Blood drawn 04/04/2018  8:02 AM   Alcohol Cap Used Yes 04/04/2018  8:02 AM       Peripheral IV 04/04/18 Left Antecubital (Active)   Site Assessment Clean, dry, & intact 04/04/2018  7:55 AM   Phlebitis Assessment 0 04/04/2018  7:55 AM   Infiltration Assessment 0 04/04/2018  7:55 AM   Dressing Status Clean, dry, & intact 04/04/2018  7:55 AM   Dressing Type Tape;Transparent 04/04/2018  7:55 AM   Hub Color/Line Status Green;Flushed;Positional 04/04/2018  7:55 AM        Opportunity for questions and clarification was provided.      Patient transported with:   Monitor  Registered Nurse

## 2018-04-04 NOTE — Procedures (Signed)
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    Procedure status: Emergent   Indication: NSTEMI,     Procedures:   LHC and LV angiography ([facility])   Selective Bilateral Coronary Angiography (Tiger )   Right Radial Access (6Fr Arterial Sheath, Micropuncture access, Intraarterial 'cocktail' of verapamil, heparin and nitroglycerin to prevent thrombosis or spasm)  * Cinefluoroscopy  * Conscious sedation administration and monitoring between 14:50 pm and 15:30 pm  Ramus Intermediate artery acute 100%bthrombus associated occlusion: PTCA and placement of an Onyx Drug eluting stent    Hemodynamics:     LVEDP 25  LV 125//25  Ao 125/78    LVGram:    EF:  60% with mild anterolateral hypokinesis    Mitral Regurgitation: none    No significant gradient across the aortic valve    Cors:  Dominance: right  LM: normal  LAD: 40% irregularities but no significant disease, 60% small D--> MM   RI: moderate caliber with 100% thrombus associated occlusion  Proximally with TIMI 0 flow-culprit lesion  LCX: nondominant large with 20-30% irregularities but no significant obstructive disease  RCA: large dominant vessel with 20-40%  irregularities but no significant obstructive disease    PCI of RI artery 100% thrombus associated occlusion and stenosis :  Lesion characteristics: high RISK: 27m length, Thrombus ++, mild tortuosity, no calcification  Anticoagulation: Heparin,Integrillin  Guiding catheter: 6Fr EBU 3.5Catheter  Wire(s):Runthrough    Pre dilation Balloon(s):2x8 mm compliant balloon to rated pressure   Stent(s):ONYX 2.5x18 mm Drug eluting stent deployed at 14 atm  Post dilation Balloon(s): 2.5x12 NC balloon to rated pressure  Result: 0% residual stenosis, excellent stent expansion , TIMI III flow    Closure: TR   Reverse Barbeau Normal  Estimated Blood Loss: Minimal  Specimens: None  Assistants: Per Mac[employer]  Complications: None

## 2018-04-04 NOTE — H&P (Signed)
History and Physical      NAME:  David Heath   DOB:   25-Jul-1970   MRN:   [id number]     Date/Time:  04/04/2018     Chief Complaint: Chest pain      History of Present Illness:        Mr. Earlie Counts is a 47 y.o.   male with a PMH of hypertension and GERD who presents with c/c of chest pain.  Patient had midsternal chest pain, nonradiating not associated with diaphoresis or palpitation.  He denies fever chills.  Denies cough or shortness of breathing.  He had CTA of the chest which was negative for PE his cardiac enzymes showed slightly elevated troponin on the second set.  Patient started on IV heparin drip, cardiology has been consulted and admitted for further evaluation and management.      Past Medical History:   Diagnosis Date   ??? Gastrointestinal disorder         No past surgical history on file.    Social History     Tobacco Use   ??? Smoking status: Current Every Day Smoker     Packs/day: 1.50     Years: 30.00     Pack years: 45.00   ??? Smokeless tobacco: Never Used   Substance Use Topics   ??? Alcohol use: Not Currently        No family history on file.     No Known Allergies     Prior to Admission medications    Medication Sig Start Date End Date Taking? Authorizing Provider   amLODIPine-atorvastatin (CADUET) 10-10 mg per tablet Take 1 Tab by mouth daily. Indications: high blood pressure   Yes Other, Phys, MD   esomeprazole (NEXIUM) 20 mg capsule Take 20 mg by mouth daily. Indications: gastroesophageal reflux disease   Yes Provider, Historical          Review of systems:     CONSTITUTIONAL: No Fever, No chills, No weight loss, No Night sweats  HEENT:  No epistaxis, No diff in swallowing  CVS: No palpitations, No syncope, No peripheral edema, No PND, No orthopnea  RS: No shortness of breath, No cough, No hemoptysis, No pleuritic chest pain  GI: No abd pain, No vomitting, No diarrhea, No hematemesis, No rectal bleeding, No acid reflux or heartburn  NEURO: No focal weakness, No  headaches, No seizures  PSYCH: No anxiety, No depression  MUSCULOSKLETAL: No joint pain or swelling  GU: No hematuria or dysuria  SKIN: No rash      Physical Exam:       VITALS:    Vital signs reviewed; most recent are:    Visit Vitals  BP 123/73 (BP 1 Location: Left arm, BP Patient Position: Supine)   Pulse (!) 53   Temp 97.8 ??F (36.6 ??C)   Resp 18   Ht 6' (1.829 m)   Wt 97.1 kg (214 lb)   SpO2 97%   BMI 29.02 kg/m??     SpO2 Readings from Last 6 Encounters:   04/04/18 97%            Intake/Output Summary (Last 24 hours) at 04/04/2018 1409  Last data filed at 04/04/2018 1037  Gross per 24 hour   Intake 550 ml   Output ???   Net 550 ml        GENERAL: Not in acute distress  HEENT: pink conjunctiva, un icteric sclera,   NECK: No lymphadenopthy or thyroid swelling, JVD not seen  LYMPH: No supraclavicular or cervical or axillary nodes on both sides  CVS: S1S2, No murmurs, No gallop or rub  RS: CTA, No wheezing or crackles  Abd: Soft, non tender, not distended, No guarding, No rigidity  NEURO:  No focal neurologic deficits   Extrm: no leg edema or swelling   Skin: No rash      Labs:     Recent Results (from the past 24 hour(s))   CBC WITH AUTOMATED DIFF    Collection Time: 04/04/18  8:04 AM   Result Value Ref Range    WBC 13.0 (H) 4.0 - 11.0 1000/mm3    RBC 4.86 3.80 - 5.70 M/uL    HGB 14.6 12.4 - 17.2 gm/dl    HCT 42.0 37.0 - 50.0 %    MCV 86.4 80.0 - 98.0 fL    MCH 30.0 23.0 - 34.6 pg    MCHC 34.8 30.0 - 36.0 gm/dl    PLATELET 189 140 - 450 1000/mm3    MPV 10.6 (H) 6.0 - 10.0 fL    RDW-SD 41.1 35.1 - 43.9      NRBC 0 0 - 0      IMMATURE GRANULOCYTES 0.6 0.0 - 3.0 %    NEUTROPHILS 70.0 (H) 34 - 64 %    LYMPHOCYTES 21.9 (L) 28 - 48 %    MONOCYTES 5.7 1 - 13 %    EOSINOPHILS 1.5 0 - 5 %    BASOPHILS 0.3 0 - 3 %   METABOLIC PANEL, COMPREHENSIVE    Collection Time: 04/04/18  8:04 AM   Result Value Ref Range    Sodium 136 136 - 145 mEq/L    Potassium 3.8 3.5 - 5.1 mEq/L    Chloride 104 98 - 107 mEq/L    CO2 22 21 - 32 mEq/L     Glucose 146 (H) 74 - 106 mg/dl    BUN 14 7 - 25 mg/dl    Creatinine 1.0 0.6 - 1.3 mg/dl    GFR est AA >60      GFR est non-AA >60      Calcium 9.1 8.5 - 10.1 mg/dl    AST (SGOT) 9 (L) 15 - 37 U/L    ALT (SGPT) 29 12 - 78 U/L    Alk. phosphatase 89 45 - 117 U/L    Bilirubin, total 0.4 0.2 - 1.0 mg/dl    Protein, total 7.7 6.4 - 8.2 gm/dl    Albumin 4.0 3.4 - 5.0 gm/dl    Anion gap 10 5 - 15 mmol/L   TROPONIN I    Collection Time: 04/04/18  8:04 AM   Result Value Ref Range    Troponin-I <0.015 0.000 - 0.045 ng/ml   MAGNESIUM    Collection Time: 04/04/18  8:04 AM   Result Value Ref Range    Magnesium 1.5 (L) 1.6 - 2.6 mg/dl   POC CREATININE    Collection Time: 04/04/18  8:04 AM   Result Value Ref Range    Creatinine 0.9 0.6 - 1.3 mg/dl   PTT    Collection Time: 04/04/18  8:04 AM   Result Value Ref Range    aPTT 28.2 25.1 - 36.5 seconds   TROPONIN I    Collection Time: 04/04/18 11:14 AM   Result Value Ref Range    Troponin-I 0.109 (H) 0.000 - 0.045 ng/ml         Active Problems:    Hypomagnesemia (04/04/2018)      Chest pain (04/04/2018)  Elevated troponin (04/04/2018)        Assessment:       1. Chest pain  2. Elevated troponin  3. Hypertension  4. GERD    Plan:       ?? Patient admitted to telemetry unit  ?? Cardiology already consulted  ?? Continue IV heparin drip  ?? IV Dilaudid for pain  ?? Discussed with the patient and family at bedside  ?? Full CODE STATUS    Total time:  57 minutes        _______________________________________________________________________        Attending Physician: Stanton Kidney, MD       Copies: Lacey Jensen, MD

## 2018-04-04 NOTE — Procedures (Signed)
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    Procedure status: Emergent   Indication: NSTEMI,     Procedures:   LHC and LV angiography ([facility]tail)   Selective Bilateral Coronary Angiography (Tiger )   Right Radial Access (6Fr Arterial Sheath, Micropuncture access, Intraarterial 'cocktail' of verapamil, heparin and nitroglycerin to prevent thrombosis or spasm)  * Cinefluoroscopy  * Conscious sedation administration and monitoring between 14:50 pm and 15:30 pm  Ramus Intermediate artery acute 100%bthrombus associated occlusion: PTCA and placement of an Onyx Drug eluting stent    Hemodynamics:     LVEDP 25  LV 125//25  Ao 125/78    LVGram:    EF:  60% with mild anterolateral hypokinesis    Mitral Regurgitation: none    No significant gradient across the aortic valve    Cors:  Dominance: right  LM: normal  LAD: 40% irregularities but no significant disease, 60% small D--> MM  RI: moderate caliber with 100%  thrombus associated occlusion  Proximally with TIMI 0 flow-culprit lesion  LCX: nondominant large with 20-30% irregularities but no significant obstructive disease  RCA: large dominant vessel with 20-40%  irregularities but no significant obstructive disease    PCI of RI artery 100% thrombus associated occlusion and stenosis :  Lesion characteristics: high RISK: 65m length, Thrombus ++, mild tortuosity, no calcification  Anticoagulation: Heparin,Integrillin  Guiding catheter: 6Fr EBU 3.5Catheter  Wire(s):Runthrough    Pre dilation Balloon(s):2x8 mm compliant balloon to rated pressure   Stent(s):ONYX 2.5x18 mm Drug eluting stent deployed at 14 atm  Post dilation Balloon(s): 2.5x12 NC balloon to rated pressure  Result: 0% residual stenosis, excellent stent expansion , TIMI III flow    Closure: TR   Reverse Barbeau Normal  Estimated Blood Loss: Minimal  Specimens: None  Assistants: Per [facility]  Complications: None

## 2018-04-04 NOTE — ED Provider Notes (Signed)
[facility]  Emergency Department Treatment Report    Patient: David Heath Age: 47 y.o. Sex: male    Date of Birth: 08/18/1970 Admit Date: 04/04/2018 PCP: Lacey Jensen, MD   MRN: [id number]  CSN: [id number]     Room: ER30/ER30 Time Dictated: 8:04 AM        Chief Complaint   Chest pain  History of Present Illness   47 y.o. male with history of hypertension and reflux.  He presents with mid sternal chest pain.  Describes it as severe and with sudden onset beginning while at work early this morning.  States that he was at his baseline health prior to that.  No recent illness or injury.  He denies fever, cough, shortness of breath, edema, abdominal pain, back pain.  Patient received aspirin and nitroglycerin by EMS.  He received dose of atropine as well secondary to bradycardia.  Reportedly was normotensive throughout.    Review of Systems   Review of Systems   Constitutional: Negative for chills and fever.   HENT: Negative for sore throat.    Respiratory: Negative for cough and shortness of breath.    Cardiovascular: Positive for chest pain. Negative for leg swelling.   Gastrointestinal: Negative for abdominal pain, nausea and vomiting.   Genitourinary: Negative for dysuria and flank pain.   Musculoskeletal: Negative for joint pain and myalgias.   Skin: Negative for itching and rash.   Neurological: Negative for dizziness, focal weakness and headaches.   Psychiatric/Behavioral: Negative for depression. The patient is not nervous/anxious.        Past Medical/Surgical History     Past Medical History:   Diagnosis Date   ??? Gastrointestinal disorder      No past surgical history on file.    Social History     Social History     Socioeconomic History   ??? Marital status: MARRIED     Spouse name: Not on file   ??? Number of children: Not on file   ??? Years of education: Not on file   ??? Highest education level: Not on file   Tobacco Use   ??? Smoking status: Current Every Day Smoker     Packs/day: 1.50     Years:  30.00     Pack years: 45.00   ??? Smokeless tobacco: Never Used   Substance and Sexual Activity   ??? Alcohol use: Not Currently   ??? Drug use: Never       Family History   No family history on file.    Current Medications     Current Facility-Administered Medications   Medication Dose Route Frequency Provider Last Rate Last Dose   ??? magnesium sulfate 2 g/50 ml IVPB (premix or compounded)  2 g IntraVENous NOW Lindajo Royal, MD 50 mL/hr at 04/04/18 0936 2 g at 04/04/18 2706     Current Outpatient Medications   Medication Sig Dispense Refill   ??? amLODIPine-atorvastatin (CADUET) 10-10 mg per tablet Take 1 Tab by mouth daily. Indications: high blood pressure         Allergies   No Known Allergies    Physical Exam     ED Triage Vitals [04/04/18 0756]   ED Encounter Vitals Group      BP 116/77      Pulse (Heart Rate) (!) 55      Resp Rate 15      Temp       Temp src  O2 Sat (%) 98 %      Weight 214 lb      Height 6'     Physical Exam   Constitutional: He is oriented to person, place, and time. He appears distressed (Patient appears in pain with frequent moaning and grunting).   HENT:   Head: Normocephalic and atraumatic.   Cardiovascular: Regular rhythm, normal heart sounds and intact distal pulses. Bradycardia present.   Pulmonary/Chest: Effort normal and breath sounds normal. No respiratory distress.   Abdominal: Soft. Bowel sounds are normal. There is no tenderness.   Musculoskeletal: He exhibits no edema.   Neurological: He is alert and oriented to person, place, and time. He exhibits normal muscle tone.   Skin: Skin is warm and dry.   Psychiatric: Mood and affect normal.   Nursing note and vitals reviewed.      Impression and Management Plan   Acute onset chest pain appears severe in nature with patient frequent grunting.  Concern for possible dissection.  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  Mild bradycardia.  Will treat pain with Dilaudid.  Check EKG, CBC, chemistry,  troponin.  CTA chest and abdomen.    Diagnostic Studies   Lab:   Recent Results (from the past 12 hour(s))   CBC WITH AUTOMATED DIFF    Collection Time: 04/04/18  8:04 AM   Result Value Ref Range    WBC 13.0 (H) 4.0 - 11.0 1000/mm3    RBC 4.86 3.80 - 5.70 M/uL    HGB 14.6 12.4 - 17.2 gm/dl    HCT 42.0 37.0 - 50.0 %    MCV 86.4 80.0 - 98.0 fL    MCH 30.0 23.0 - 34.6 pg    MCHC 34.8 30.0 - 36.0 gm/dl    PLATELET 189 140 - 450 1000/mm3    MPV 10.6 (H) 6.0 - 10.0 fL    RDW-SD 41.1 35.1 - 43.9      NRBC 0 0 - 0      IMMATURE GRANULOCYTES 0.6 0.0 - 3.0 %    NEUTROPHILS 70.0 (H) 34 - 64 %    LYMPHOCYTES 21.9 (L) 28 - 48 %    MONOCYTES 5.7 1 - 13 %    EOSINOPHILS 1.5 0 - 5 %    BASOPHILS 0.3 0 - 3 %   METABOLIC PANEL, COMPREHENSIVE    Collection Time: 04/04/18  8:04 AM   Result Value Ref Range    Sodium 136 136 - 145 mEq/L    Potassium 3.8 3.5 - 5.1 mEq/L    Chloride 104 98 - 107 mEq/L    CO2 22 21 - 32 mEq/L    Glucose 146 (H) 74 - 106 mg/dl    BUN 14 7 - 25 mg/dl    Creatinine 1.0 0.6 - 1.3 mg/dl    GFR est AA >60      GFR est non-AA >60      Calcium 9.1 8.5 - 10.1 mg/dl    AST (SGOT) 9 (L) 15 - 37 U/L    ALT (SGPT) 29 12 - 78 U/L    Alk. phosphatase 89 45 - 117 U/L    Bilirubin, total 0.4 0.2 - 1.0 mg/dl    Protein, total 7.7 6.4 - 8.2 gm/dl    Albumin 4.0 3.4 - 5.0 gm/dl    Anion gap 10 5 - 15 mmol/L   TROPONIN I    Collection Time: 04/04/18  8:04 AM  Result Value Ref Range    Troponin-I <0.015 0.000 - 0.045 ng/ml   MAGNESIUM    Collection Time: 04/04/18  8:04 AM   Result Value Ref Range    Magnesium 1.5 (L) 1.6 - 2.6 mg/dl   POC CREATININE    Collection Time: 04/04/18  8:04 AM   Result Value Ref Range    Creatinine 0.9 0.6 - 1.3 mg/dl       Imaging:    Cta Chest W Or W Wo Cont    Result Date: 04/04/2018  INDICATION: Pain. COMPARISON: None available. TECHNIQUE: CTA chest, abdomen and pelvis with and without intravenous contrast. DICOM format image data is available to non-affiliated external healthcare facilities or  entities on a secure, media free, reciprocally searchable basis with patient authorization for 12 months following the date of the study. Findings: CHEST: No obvious filling defects within the pulmonary arteries. The heart is normal in size. No aneurysmal dilatation, dissection or intramural hematoma. Subtle calcification involving the aortic arch. The heart is probably within normal limits. Trace pericardial effusion is noted dependently and anteriorly. No lymphadenopathy. A 4 mm left major fissure associated nodule (image 22 on series 7). No additional dominant nodules. ABDOMEN/PELVIS: Normal enhancement of the aorta and major vessels arising from the aorta. No significant atherosclerotic disease. No aneurysm or dissection. Liver, gallbladder, spleen, pancreas, and adrenal glands grossly unremarkable. An indeterminate 7-8 mm enhancing lesion arising from the posterior inferior right renal cortex (image 125 on series 6 and image 62 on series 9). Left renal cysts, measuring up to 3.7 cm. Ascending and transverse colon are decompressed. No definite inflammatory change. A normal appendix. No retroperitoneal lymphadenopathy. A few scattered mesenteric lymph nodes, which are increased in number. Small fat-containing umbilical hernia.     Impression: 1. No acute abnormalities. 2. A 4 mm nodule associated with the left major fissure. Follow-up per Fleischner criteria. ABDOMEN/PELVIS: 1. No acute abnormalities. 2. An indeterminate 7 mm enhancing right renal cortical lesion. This may reflect a benign or malignant lesion. Continued surveillance is recommended. MRI with and without contrast may be obtained for better evaluation. 3. Left renal cysts. 4. Small fat-containing umbilical hernia. 5. A few scattered mesenteric lymph nodes, which are slightly increased in number. Exact etiology is unknown. Findings may reflect mesenteric adenitis. SOLID NODULES Nodule Size:  < 6 mm Low-Risk Patient:  No CT followup needed. High-Risk  Patient: Optional CT follow-up at 12 months Low-Risk Patient with multiple nodules: No CT follow-up needed. High-Risk Patient with multiple nodules: Optional CT follow-up at 12 months. Nodule Size: 6-8 mm Low-Risk Patient: CT at 6-12 months, then consider CT at 18-24 months. High-Risk Patient:  Initial followup at 6-12 months and then at 18-24 months if no change. Low-Risk Patient with multiple nodules: CT at 3-6 months, then consider CT at 18-24 months if no change. High-Risk Patient with multiple nodules: CT at 3-6 months, then at 18-24 months if no change. Nodule Size:  >8 mm Low-Risk Patient: Consider CT, PET/CT, or tissue sampling at 3 months. High-Risk Patient:  Consider CT, PET/CT, or tissue sampling at 3 months. Low-Risk Patient with multiple nodules: CT at 3-6 months, then consider CT at 18-24 months if no change. High-Risk Patient with multiple nodules: CT at 3-6 months, then at 18-24 months if no change. SUBSOLID NODULES: Nodule Size:  < 6 mm Groundglass: No CT followup needed. Part solid: No CT followup needed. Multiple nodules: CT at 3-6 months. If stable, consider CT at 2 and 4 years. Nodule  size > 6 mm Groundglass: CT at 6-12 months to confirm presence. Then CT every 2 years until 5 years. Part solid: CT at 3-6 months to confirm presence. If unchanged and solid component remains less than 6 mm, annual CT should be performed for 5 years. Multiple nodules: CT at 3-6 months. Subsequent management based on the most suspicious nodule.     Cta Abd Pelv W Wo Cont    Result Date: 04/04/2018  INDICATION: Pain. COMPARISON: None available. TECHNIQUE: CTA chest, abdomen and pelvis with and without intravenous contrast. DICOM format image data is available to non-affiliated external healthcare facilities or entities on a secure, media free, reciprocally searchable basis with patient authorization for 12 months following the date of the study. Findings: CHEST: No obvious filling defects within the pulmonary  arteries. The heart is normal in size. No aneurysmal dilatation, dissection or intramural hematoma. Subtle calcification involving the aortic arch. The heart is probably within normal limits. Trace pericardial effusion is noted dependently and anteriorly. No lymphadenopathy. A 4 mm left major fissure associated nodule (image 22 on series 7). No additional dominant nodules. ABDOMEN/PELVIS: Normal enhancement of the aorta and major vessels arising from the aorta. No significant atherosclerotic disease. No aneurysm or dissection. Liver, gallbladder, spleen, pancreas, and adrenal glands grossly unremarkable. An indeterminate 7-8 mm enhancing lesion arising from the posterior inferior right renal cortex (image 125 on series 6 and image 62 on series 9). Left renal cysts, measuring up to 3.7 cm. Ascending and transverse colon are decompressed. No definite inflammatory change. A normal appendix. No retroperitoneal lymphadenopathy. A few scattered mesenteric lymph nodes, which are increased in number. Small fat-containing umbilical hernia.     Impression: 1. No acute abnormalities. 2. A 4 mm nodule associated with the left major fissure. Follow-up per Fleischner criteria. ABDOMEN/PELVIS: 1. No acute abnormalities. 2. An indeterminate 7 mm enhancing right renal cortical lesion. This may reflect a benign or malignant lesion. Continued surveillance is recommended. MRI with and without contrast may be obtained for better evaluation. 3. Left renal cysts. 4. Small fat-containing umbilical hernia. 5. A few scattered mesenteric lymph nodes, which are slightly increased in number. Exact etiology is unknown. Findings may reflect mesenteric adenitis. SOLID NODULES Nodule Size:  < 6 mm Low-Risk Patient:  No CT followup needed. High-Risk Patient: Optional CT follow-up at 12 months Low-Risk Patient with multiple nodules: No CT follow-up needed. High-Risk Patient with multiple nodules: Optional CT follow-up at 12 months. Nodule Size: 6-8  mm Low-Risk Patient: CT at 6-12 months, then consider CT at 18-24 months. High-Risk Patient:  Initial followup at 6-12 months and then at 18-24 months if no change. Low-Risk Patient with multiple nodules: CT at 3-6 months, then consider CT at 18-24 months if no change. High-Risk Patient with multiple nodules: CT at 3-6 months, then at 18-24 months if no change. Nodule Size:  >8 mm Low-Risk Patient: Consider CT, PET/CT, or tissue sampling at 3 months. High-Risk Patient:  Consider CT, PET/CT, or tissue sampling at 3 months. Low-Risk Patient with multiple nodules: CT at 3-6 months, then consider CT at 18-24 months if no change. High-Risk Patient with multiple nodules: CT at 3-6 months, then at 18-24 months if no change. SUBSOLID NODULES: Nodule Size:  < 6 mm Groundglass: No CT followup needed. Part solid: No CT followup needed. Multiple nodules: CT at 3-6 months. If stable, consider CT at 2 and 4 years. Nodule size > 6 mm Groundglass: CT at 6-12 months to confirm presence. Then CT every  2 years until 5 years. Part solid: CT at 3-6 months to confirm presence. If unchanged and solid component remains less than 6 mm, annual CT should be performed for 5 years. Multiple nodules: CT at 3-6 months. Subsequent management based on the most suspicious nodule.       EKG???sinus bradycardia, rate 55 bpm.  Normal axis.  Normal intervals.  Nonspecific ST abnormality.  Ysidro Evert.  Medical Decision Making/ED Course   Patient has remained medically stable through emergency department evaluation and observation.  He is improved with symptomatic medications.  CTA of chest abdomen pelvis shows no indication of aortic dissection or aneurysm.  No evidence of pulmonary embolus.  No urgent findings noted.  EKG is nonspecific.  Initial troponin is normal.  Patient has had mild bradycardia with a normal blood pressure.  Magnesium is mildly low and is repleted in emergency department.  He was treated with aspirin and nitroglycerin by EMS.  Also  received pain medicine and GI cocktail in the ED.  At this point will place in emergency department observation for serial troponins and provocative testing.  If negative will treat symptoms as GI.  Patient does have right renal cortical lesion that will require further evaluation as an outpatient.  I have discussed this with him.  He voices understanding.    Final Diagnosis       ICD-10-CM ICD-9-CM   1. Chest pain, unspecified type R07.9 786.50   2. Hypomagnesemia E83.42 275.2   3. Renal lesion N28.9 593.9     Disposition   ED observation    Lindajo Royal, M.D.  April 04, 2018    *Portions of this electronic record were dictated using Systems analyst.  Unintended errors in translation may occur.    My signature above authenticates this document and my orders, the final ??  diagnosis (es), discharge prescription (s), and instructions in the Epic ??  record.  If you have any questions please contact [phone].  ??  Nursing notes have been reviewed by the physician/ advanced practice ??  Clinician.

## 2018-04-04 NOTE — ED Notes (Signed)
Pt Arrived Via EMS with complaints of chest pain that started this morning. Pt states he has a history of Acid reflux, which he originally thought was the issue when this pain started. Pt current pain level 10 on a scale of 1-10. Pt alert and oriented upon initial assesment

## 2018-04-04 NOTE — ED Notes (Signed)
IV fluids complete. Pt stated he received relief with medication given. See MAR. Pt resting in bed with eyes closed. Call bell within reach.

## 2018-04-04 NOTE — ED Notes (Signed)
Mag Sulfate complete. Alcohol cap placed. Pt remains asleep in bed with eyes closed. Provided Pt with blanket. Call bell within reach.

## 2018-04-04 NOTE — ED Notes (Signed)
 This RN at bedside. Pt received sublingual Nitro. IV fluids initially started at 250ml via pump. Upon assessment of vital signs significant drop in blood pressure noted. Converted Iv fluids to bolus by gravity. Pt states he feels lightheaded and strange. Blood pressure remains at 87/48 with IV fluid bolus. Symptomatic bradycardia noted.

## 2018-04-04 NOTE — Progress Notes (Signed)
Second troponin is mildly elevated at 0.109.  Patient reports recurrence of chest pain symptoms.  Repeat EKG shows sinus bradycardia with rate 49 bpm with normal axis and normal intervals.  No ST or T changes.  Will treat with dose of nitroglycerin.  Adding heparin.  Cardiology is consulted.  Discussed with Dr. Cleon GustinAshby.  Will admit to hospitalist.  Discussed with Dr. Theodis AguasYared G Terefe for admission.

## 2018-04-04 NOTE — Consults (Signed)
[facility] Consultation Note      David Heath 47 y.o.  DOB: [birth year]  MRN: [id number]  SSN: [id number]      PCP: Sol Blazing, MD    DOA: 04/04/2018     Reason for consult: chest pain  Requesting physician: Dr. Kizzie Bane  Outpatient cardiologist: none    Impression:   NSTEMI with troponin 0.109  Chest Pain at rest--severe crushing sharp and dull to shoulder   - CE's 0.015, 0.109, no acute ECG changes  Leukocytosis, likely reactive  Hypertension  HLD, currently being treated with diet and exercise  GERD  Tobacco abuse/dependence  + Family hx premature CAD      Recommendations:   Heparin gtt and ACS cores to include high intensity statin and BB if HR will allow +/- ACEI  2mg  IV morphine ordered, pain still 7/10  Will proceed urgently with cardiac catheterization poss PCI  Risks and benefits explained to patient and family in detail  Agreeable to strict compliance with DAPT if needed  A1c and lipid panel and TSH for further risk stratification  Nicotine patch and smoking cessation recommended  Trend CE  2D echo tomorrow to assess for WMAs/LV function  BMP/CBC tomorrow morning, gently hydrate overnight  Further recommendations pending clinical course    Cardiology attending    Seen and ex indep.  Agree with details of consultation as outlined by PA Lady Gary with additions and modifications as outlined.    46 yo smoker with HTN and FH CAD presenting with 5 hours of severe crushing sharp and dull CP at rest radiating to shoulder not better or worse with any maneuvers with SOB and weakness without ECG changes.  First trop neg and  ECG no acute changes .  Repeat trop indicates NSTEMI.  Still having CP .    BP 121/68    Pulse 68    Temp 97.8 ??F (36.6 ??C)    Resp 21    Ht 6' (1.829 m)    Wt 97.1 kg (214 lb)    SpO2 97%    BMI 29.02 kg/m??   Lungs CTA  CV RRR nl PMI   No edema  R hand Allen test normal    IMP and Plan as edited and outlined above    Given persistent rest angina and evidence for MI, favor emergent  invasive eval with cardiac cath poss PCI via radial access.  indic and risks d/w pt allques answered  Admit for stabilization and monitoring  GDMT for secondary prevention   Smoking cessation  Will fu with cardiology in [place] --[facility] in  [place]    Critically ill, complex med decision making    Decision to proceed with cardiac cath PCI made my me after discussion with pt today    Cyndia Diver, MD        HPI:     David Heath is a 47 y.o. male who is being seen in cardiology consultation for chest pain/NSTEMI. 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 He reports pain has been severe waxing and waning. He did have some radiation into his shoulders, with shortness of breath associated and claminess. He did get some relief from NTG however currently states "it feels like hercules is stepping on my chest." Family at bedside. Patient has smoke a pack a day for about 40 years. Mother died of MI in her 56s, father with recent cardiac cath. Denies  PND, orthopnea, palpitations, LE edema. Has been diagnosed with dyslipidemia but is not on statin.     No bleeding    + FH CAD mo and father  Smokes   Drives a truck    Patient Active Problem List    Diagnosis Date Noted   ??? Hypomagnesemia 04/04/2018   ??? Chest pain 04/04/2018   ??? Elevated troponin 04/04/2018       Past Medical History:   Diagnosis Date   ??? Gastrointestinal disorder      No past surgical history on file.  Social History     Socioeconomic History   ??? Marital status: MARRIED     Spouse name: Not on file   ??? Number of children: Not on file   ??? Years of education: Not on file   ??? Highest education level: Not on file   Occupational History   ??? Not on file   Social Needs   ??? Financial resource strain: Not on file   ??? Food insecurity:     Worry: Not on file     Inability: Not on file   ??? Transportation needs:     Medical: Not on file     Non-medical: Not on file   Tobacco Use   ???  Smoking status: Current Every Day Smoker     Packs/day: 1.50     Years: 30.00     Pack years: 45.00   ??? Smokeless tobacco: Never Used   Substance and Sexual Activity   ??? Alcohol use: Not Currently   ??? Drug use: Never   ??? Sexual activity: Not on file   Lifestyle   ??? Physical activity:     Days per week: Not on file     Minutes per session: Not on file   ??? Stress: Not on file   Relationships   ??? Social connections:     Talks on phone: Not on file     Gets together: Not on file     Attends religious service: Not on file     Active member of club or organization: Not on file     Attends meetings of clubs or organizations: Not on file     Relationship status: Not on file   ??? Intimate partner violence:     Fear of current or ex partner: Not on file     Emotionally abused: Not on file     Physically abused: Not on file     Forced sexual activity: Not on file   Other Topics Concern   ??? Not on file   Social History Narrative   ??? Not on file     No family history on file.    No Known Allergies    Home Medications:     Prior to Admission medications    Medication Sig Start Date End Date Taking? Authorizing Provider   amLODIPine-atorvastatin (CADUET) 10-10 mg per tablet Take 1 Tab by mouth daily. Indications: high blood pressure   Yes Other, Phys, MD   esomeprazole (NEXIUM) 20 mg capsule Take 20 mg by mouth daily. Indications: gastroesophageal reflux disease   Yes Provider, Historical       Current Facility-Administered Medications   Medication Dose Route Frequency   ??? sodium chloride (NS) flush 5-10 mL  5-10 mL IntraVENous Q8H   ??? heparin (porcine) 25,000 units in 0.45% saline 500 ml infusion  0-25 Units/kg/hr IntraVENous TITRATE   ??? influenza vaccine 2019-20 (4 yrs+)(PF) (FLUCELVAX QUAD) injection 0.5  mL  0.5 mL IntraMUSCular PRIOR TO DISCHARGE         Review of Systems:     Positives in bold    Constitutional: No fever, chills, weight loss, weight gain, weakness, fatigue   Eyes: No blurred vision, double vision  ENT: No sore  throat, congestion, headache   Respiratory: No cough, shortness of breath, wheezing  Cardiovascular: No chest pain/pressure, orthopnea, PND, edema, palpitations, syncope  Gastrointestinal: No abdominal pain, nausea, vomiting, diarrhea, constipation, melena, hematochezia, hematemesis  Genitourinary: No painful urination, frequency, urgency  Musculoskeletal: No joint pain, swelling, falls  Integumentary: No rashes, wounds  Endocrine: no heat or cold intolerance, polydipsia, polyuria  Neurological: No headache, dizziness, extremity weakness, paresthesias, presyncope, syncope   Psychiatric: No depression, anxiety, substance abuse       Physical Examination:     Visit Vitals  BP 123/73 (BP 1 Location: Left arm, BP Patient Position: Supine)   Pulse (!) 53   Temp 97.8 ??F (36.6 ??C)   Resp 18   Ht 6' (1.829 m)   Wt 97.1 kg (214 lb)   SpO2 97%   BMI 29.02 kg/m??         General: NAD  HEENT: oral mucosa well perfused; conjunctiva not injected, sclera anicteric  Neck: No JVD trachea midline  Resp: Clear to auscultation bilaterally; No wheezes or rales, normal effort no accessory muscle use  Cardiovascular: regular rhythm normal s1and s2; No murmurs or rubs. Palpable DP pulses  Abd: Positive Bowel Sounds, Soft, Nontender nondistended  Ext: No clubbing, cyanosis, no edema warm and well perfused  Neuro: Alert and oriented X3, Nonfocal; moves all extremities  Skin: Warm, Dry, Intact    ECG: SR    Labs:    GFR: Estimated Creatinine Clearance: 110.3 mL/min (based on SCr of 1 mg/dL).     Lab Results   Component Value Date/Time    WBC 13.0 (H) 04/04/2018 08:04 AM    RBC 4.86 04/04/2018 08:04 AM    HGB 14.6 04/04/2018 08:04 AM    HCT 42.0 04/04/2018 08:04 AM    MCV 86.4 04/04/2018 08:04 AM    MCH 30.0 04/04/2018 08:04 AM    MCHC 34.8 04/04/2018 08:04 AM    PLT 189 04/04/2018 08:04 AM       Lab Results   Component Value Date/Time    GRANS 70.0 (H) 04/04/2018 08:04 AM    LYMPH 21.9 (L) 04/04/2018 08:04 AM    MONOS 5.7 04/04/2018 08:04 AM     EOS 1.5 04/04/2018 08:04 AM    BASOS 0.3 04/04/2018 08:04 AM       Lab Results   Component Value Date    NA 136 04/04/2018    K 3.8 04/04/2018    CL 104 04/04/2018    CO2 22 04/04/2018    BUN 14 04/04/2018    CREA 1.0 04/04/2018    CREA 0.9 04/04/2018    GLU 146 (H) 04/04/2018    CA 9.1 04/04/2018    MG 1.5 (L) 04/04/2018        Lab Results   Component Value Date/Time    Troponin-I 0.109 (H) 04/04/2018 11:14 AM        No results found for: TSH, TSH2, TSH3, TSHP, TSHELE, TT3, T3U, T3UP, FRT3, FT3, FT4, FT4P, T4, T4P, FT4T, TT7, TSHEXT     Lab Results   Component Value Date    APTT 28.2 04/04/2018       No results found for: HBA1C, HGBE8, HBA1CPOC, HBA1CEXT  No results found for: CHOL, CHOLPOCT, CHOLX, CHLST, CHOLV, HDL, HDLPOC, HDLP, LDL, LDLCPOC, LDLC, DLDLP, VLDLC, VLDL, TGLX, TRIGL, TRIGP, TGLPOCT, CHHD, CHHDX     Lab Results   Component Value Date    ALB 4.0 04/04/2018    TP 7.7 04/04/2018    TBILI 0.4 04/04/2018    ALT 29 04/04/2018    SGOT 9 (L) 04/04/2018    AP 89 04/04/2018       No results found for: SPGRU, PHU, PROTU, [place], KETU, BLDU, NITU, UROU, LEUKU, WBCU, RBCU, EPSU, BACTU    Myrtha Mantis, [place]  April 04, 2018, 1:59 PM

## 2018-04-04 NOTE — ED Notes (Signed)
Pt arrived Via EMS with a chief complaint of chest pain. 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 Pt given 324 Aspirin prior to EMS arrival. Upon assessment by  EMS PT noted to have symptomatic bradycardia. Pt given 0.5 atropine via Ems. No change. IVF given via EMS. Upon arrival to ER PT states pain is greater than 10. Pt states this pain has been consistent for the entire morning. Pt vital signs assessed. Call bell placed within reach.

## 2018-04-04 NOTE — Progress Notes (Signed)
Spoke to patient's RN, she informed me that the stress echo ordered today is cancelled

## 2018-04-04 NOTE — Op Note (Signed)
Prelim cath findings    LHC LVG Cors PCI RI with DES  No complic  ebl minimal    Findings    LVEDP 25  LV 125//25  Ao 125/78    LVEF 60%    LM ok  LAD 40% irreg, 60% small D--> MM  RI moderate 100% prox-culprit  LCx nondom 20-30% irregs, NSD  RCA large dom 20-40% irregs, NSD    PCI RI 100% occlusion:  6 Fr EBU 3.5, Runthrough, heparin, Integrilin    Pre dilated 2x8  Stent ONYX 2.5x18  Post 2.5x12 NC to rated    0% residual TIMI III flow    PLAN:    TR band R radial  Asa and Brilinta xi yr--needs copay card and 30 d free card  BB and ACEI to replace norvasc  lipitor  Labs  Echo  Possible dc in am if remains stable given normal LVEF and small MI  Smoking cessaation counseled  F/u Cards [facility] [place]    Dr Cleon Gustin rounding tomorrow    Cyndia Diver, MD

## 2018-04-04 NOTE — ED Notes (Signed)
Pt returned from CT. Pain assessment complete. Pt states pain fluctuates between 5 and 8 on a scale of 1-10. Vitals assessed. Blood pressures obtained from Bilateral  Upper extremities. Pt alert and oriented. Returned to cardiac monitor.

## 2018-04-04 NOTE — Other (Signed)
TRANSFER - OUT REPORT:    Verbal report given to Dahlia ClientHannah on Roselie AwkwardJoseph [name]  being transferred to [facility] for routine progression of care       Report consisted of patient???s Situation, Background, Assessment and   Recommendations(SBAR).     Information from the following report(s) Procedure Summary was reviewed with the receiving nurse.    Opportunity for questions and clarification was provided.      Patient transported with:   Registered Nurse

## 2018-04-04 NOTE — ED Notes (Signed)
This RN at bedside. Pt received sublingual Nitro. IV fluids initially started at 250ml via pump. Upon assessment of vital signs significant drop in blood pressure noted. Converted Iv fluids to bolus by gravity. Pt states he "feels lightheaded and strange". Blood pressure remains at 87/48 with IV fluid bolus. Symptomatic bradycardia noted.

## 2018-04-04 NOTE — Other (Signed)
2036: Paged hospital MD on call regarding nexium 20 mg for GERD. Order granted by MD on call.

## 2018-04-04 NOTE — Other (Signed)
Bedside shift change report given to [place] (oncoming nurse) by Stark Bray (offgoing nurse). Report included the following information SBAR, Intake/Output, MAR, Recent Results, Cardiac Rhythm Sinus Huston Foley and Alarm Parameters .

## 2018-04-04 NOTE — Progress Notes (Signed)
Spoke to patient's RN, she informed me that the stress echo ordered today is cancelled

## 2018-04-04 NOTE — Consults (Addendum)
[facility] Consultation [facility]      David Heath 47 y.o.  DOB: Dec 26, 1970  MRN: [id number]  SSN: [id number]      PCP: Sol Blazing, MD    DOA: 04/04/2018     Reason for consult: chest pain  Requesting physician: Dr. Kizzie Bane  Outpatient cardiologist: none    Impression:   NSTEMI with troponin 0.109  Chest Pain at rest--severe crushing sharp and dull to shoulder   - CE's 0.015, 0.109, no acute ECG changes  Leukocytosis, likely reactive  Hypertension  HLD, currently being treated with diet and exercise  GERD  Tobacco abuse/dependence  + Family hx premature CAD      Recommendations:   Heparin gtt and ACS cores to include high intensity statin and BB if HR will allow +/- ACEI  2mg  IV morphine ordered, pain still 7/10  Will proceed urgently with cardiac catheterization poss PCI  Risks and benefits explained to patient and family in detail  Agreeable to strict compliance with DAPT if needed  A1c and lipid panel and TSH for further risk stratification  Nicotine patch and smoking cessation recommended  Trend CE  2D echo tomorrow to assess for WMAs/LV function  BMP/CBC tomorrow morning, gently hydrate overnight  Further recommendations pending clinical course    Cardiology attending    Seen and ex indep.  Agree with details of consultation as outlined by PA Lady Gary with additions and modifications as outlined.    47 yo smoker with HTN and FH CAD presenting with 5 hours of severe crushing sharp and dull CP at rest radiating to shoulder not better or worse with any maneuvers with SOB and weakness without ECG changes.  First trop neg and  ECG no acute changes .  Repeat trop indicates NSTEMI.  Still having CP .    BP 121/68    Pulse 68    Temp 97.8 ??F (36.6 ??C)    Resp 21    Ht 6' (1.829 m)    Wt 97.1 kg (214 lb)    SpO2 97%    BMI 29.02 kg/m??   Lungs CTA  CV RRR nl PMI   No edema  R hand Allen test normal    IMP and Plan as edited and outlined above     Given persistent rest angina and evidence for MI, favor emergent invasive eval with cardiac cath poss PCI via radial access.  indic and risks d/w pt allques answered  Admit for stabilization and monitoring  GDMT for secondary prevention   Smoking cessation  Will fu with cardiology in [place] --[facility] in  [place]    Critically ill, complex med decision making    Decision to proceed with cardiac cath PCI made my me after discussion with pt today    Cyndia Diver, MD        HPI:     David Heath is a 47 y.o. male who is being seen in cardiology consultation for chest pain/NSTEMI. 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 He reports pain has been severe waxing and waning. He did have some radiation into his shoulders, with shortness of breath associated and claminess. He did get some relief from NTG however currently states "it feels like hercules is stepping on my chest." Family at bedside. Patient has smoke a pack a day for about 40 years. Mother died of MI in her 53s, father with recent cardiac cath. Denies  PND, orthopnea, palpitations, LE edema. Has been diagnosed with dyslipidemia but is not on statin.     No bleeding    + FH CAD mo and father  Smokes   Drives a truck    Patient Active Problem List    Diagnosis Date Noted   ??? Hypomagnesemia 04/04/2018   ??? Chest pain 04/04/2018   ??? Elevated troponin 04/04/2018       Past Medical History:   Diagnosis Date   ??? Gastrointestinal disorder      No past surgical history on file.  Social History     Socioeconomic History   ??? Marital status: MARRIED     Spouse name: Not on file   ??? Number of children: Not on file   ??? Years of education: Not on file   ??? Highest education level: Not on file   Occupational History   ??? Not on file   Social Needs   ??? Financial resource strain: Not on file   ??? Food insecurity:     Worry: Not on file     Inability: Not on file   ??? Transportation needs:      Medical: Not on file     Non-medical: Not on file   Tobacco Use   ??? Smoking status: Current Every Day Smoker     Packs/day: 1.50     Years: 30.00     Pack years: 45.00   ??? Smokeless tobacco: Never Used   Substance and Sexual Activity   ??? Alcohol use: Not Currently   ??? Drug use: Never   ??? Sexual activity: Not on file   Lifestyle   ??? Physical activity:     Days per week: Not on file     Minutes per session: Not on file   ??? Stress: Not on file   Relationships   ??? Social connections:     Talks on phone: Not on file     Gets together: Not on file     Attends religious service: Not on file     Active member of club or organization: Not on file     Attends meetings of clubs or organizations: Not on file     Relationship status: Not on file   ??? Intimate partner violence:     Fear of current or ex partner: Not on file     Emotionally abused: Not on file     Physically abused: Not on file     Forced sexual activity: Not on file   Other Topics Concern   ??? Not on file   Social History Narrative   ??? Not on file     No family history on file.    No Known Allergies    Home Medications:     Prior to Admission medications    Medication Sig Start Date End Date Taking? Authorizing Provider   amLODIPine-atorvastatin (CADUET) 10-10 mg per tablet Take 1 Tab by mouth daily. Indications: high blood pressure   Yes Other, Phys, MD   esomeprazole (NEXIUM) 20 mg capsule Take 20 mg by mouth daily. Indications: gastroesophageal reflux disease   Yes Provider, Historical       Current Facility-Administered Medications   Medication Dose Route Frequency   ??? sodium chloride (NS) flush 5-10 mL  5-10 mL IntraVENous Q8H   ??? heparin (porcine) 25,000 units in 0.45% saline 500 ml infusion  0-25 Units/kg/hr IntraVENous TITRATE   ??? influenza vaccine 2019-20 (4 yrs+)(PF) (FLUCELVAX QUAD) injection 0.5  mL  0.5 mL IntraMUSCular PRIOR TO DISCHARGE         Review of Systems:     Positives in bold     Constitutional: No fever, chills, weight loss, weight gain, weakness, fatigue   Eyes: No blurred vision, double vision  ENT: No sore throat, congestion, headache   Respiratory: No cough, shortness of breath, wheezing  Cardiovascular: No chest pain/pressure, orthopnea, PND, edema, palpitations, syncope  Gastrointestinal: No abdominal pain, nausea, vomiting, diarrhea, constipation, melena, hematochezia, hematemesis  Genitourinary: No painful urination, frequency, urgency  Musculoskeletal: No joint pain, swelling, falls  Integumentary: No rashes, wounds  Endocrine: no heat or cold intolerance, polydipsia, polyuria  Neurological: No headache, dizziness, extremity weakness, paresthesias, presyncope, syncope   Psychiatric: No depression, anxiety, substance abuse       Physical Examination:     Visit Vitals  BP 123/73 (BP 1 Location: Left arm, BP Patient Position: Supine)   Pulse (!) 53   Temp 97.8 ??F (36.6 ??C)   Resp 18   Ht 6' (1.829 m)   Wt 97.1 kg (214 lb)   SpO2 97%   BMI 29.02 kg/m??         General: NAD  HEENT: oral mucosa well perfused; conjunctiva not injected, sclera anicteric  Neck: No JVD trachea midline  Resp: Clear to auscultation bilaterally; No wheezes or rales, normal effort no accessory muscle use  Cardiovascular: regular rhythm normal s1and s2; No murmurs or rubs. Palpable DP pulses  Abd: Positive Bowel Sounds, Soft, Nontender nondistended  Ext: No clubbing, cyanosis, no edema warm and well perfused  Neuro: Alert and oriented X3, Nonfocal; moves all extremities  Skin: Warm, Dry, Intact    ECG: SR    Labs:    GFR: Estimated Creatinine Clearance: 110.3 mL/min (based on SCr of 1 mg/dL).     Lab Results   Component Value Date/Time    WBC 13.0 (H) 04/04/2018 08:04 AM    RBC 4.86 04/04/2018 08:04 AM    HGB 14.6 04/04/2018 08:04 AM    HCT 42.0 04/04/2018 08:04 AM    MCV 86.4 04/04/2018 08:04 AM    MCH 30.0 04/04/2018 08:04 AM    MCHC 34.8 04/04/2018 08:04 AM    PLT 189 04/04/2018 08:04 AM       Lab Results    Component Value Date/Time    GRANS 70.0 (H) 04/04/2018 08:04 AM    LYMPH 21.9 (L) 04/04/2018 08:04 AM    MONOS 5.7 04/04/2018 08:04 AM    EOS 1.5 04/04/2018 08:04 AM    BASOS 0.3 04/04/2018 08:04 AM       Lab Results   Component Value Date    NA 136 04/04/2018    K 3.8 04/04/2018    CL 104 04/04/2018    CO2 22 04/04/2018    BUN 14 04/04/2018    CREA 1.0 04/04/2018    CREA 0.9 04/04/2018    GLU 146 (H) 04/04/2018    CA 9.1 04/04/2018    MG 1.5 (L) 04/04/2018        Lab Results   Component Value Date/Time    Troponin-I 0.109 (H) 04/04/2018 11:14 AM        No results found for: TSH, TSH2, TSH3, TSHP, TSHELE, TT3, T3U, T3UP, FRT3, FT3, FT4, FT4P, T4, T4P, FT4T, TT7, TSHEXT     Lab Results   Component Value Date    APTT 28.2 04/04/2018       No results found for: HBA1C, HGBE8, HBA1CPOC, HBA1CEXT  No results found for: CHOL, CHOLPOCT, CHOLX, CHLST, CHOLV, HDL, HDLPOC, HDLP, LDL, LDLCPOC, LDLC, DLDLP, VLDLC, VLDL, TGLX, TRIGL, TRIGP, TGLPOCT, CHHD, CHHDX     Lab Results   Component Value Date    ALB 4.0 04/04/2018    TP 7.7 04/04/2018    TBILI 0.4 04/04/2018    ALT 29 04/04/2018    SGOT 9 (L) 04/04/2018    AP 89 04/04/2018       No results found for: SPGRU, PHU, PROTU, [place], KETU, BLDU, NITU, UROU, LEUKU, WBCU, RBCU, EPSU, BACTU    Myrtha Mantis, [place]  April 04, 2018, 1:59 PM

## 2018-04-04 NOTE — ED Notes (Signed)
Pt returned from CT. Pain assessment complete. Pt states pain fluctuates between 5 and 8 on a scale of 1-10. Vitals assessed. Blood pressures obtained from Bilateral  Upper extremities. Pt alert and oriented. Returned to cardiac monitor.

## 2018-04-04 NOTE — ED Triage Notes (Signed)
Pt Arrived Via EMS with complaints of chest pain that started this morning. Pt states he has a history of Acid reflux, which he originally thought was the issue when this pain started. Pt current pain level 10 on a scale of 1-10. Pt alert and oriented upon initial assesment

## 2018-04-04 NOTE — Brief Op Note (Signed)
Prelim cath findings    LHC LVG Cors PCI RI with DES  No complic  ebl minimal    Findings    LVEDP 25  LV 125//25  Ao 125/78    LVEF 60%    LM ok  LAD 40% irreg, 60% small D--> MM  RI moderate 100% prox-culprit  LCx nondom 20-30% irregs, NSD  RCA large dom 20-40% irregs, NSD    PCI RI 100% occlusion:  6 Fr EBU 3.5, Runthrough, heparin, Integrilin    Pre dilated 2x8  Stent ONYX 2.5x18  Post 2.5x12 NC to rated    0% residual TIMI III flow    PLAN:    TR band R radial  Asa and Brilinta xi yr--needs copay card and 30 d free card  BB and ACEI to replace norvasc  lipitor  Labs  Echo  Possible dc in am if remains stable given normal LVEF and small MI  Smoking cessaation counseled  F/u Cards [facility] [place]    Dr Ashby rounding tomorrow    Briza Bark, MD

## 2018-04-04 NOTE — Progress Notes (Signed)
Second troponin is mildly elevated at 0.109.  Patient reports recurrence of chest pain symptoms.  Repeat EKG shows sinus bradycardia with rate 49 bpm with normal axis and normal intervals.  No ST or T changes.  Will treat with dose of nitroglycerin.  Adding heparin.  Cardiology is consulted.  Discussed with Dr. Cleon Gustin.  Will admit to hospitalist.  Discussed with Dr. Theodis Aguas for admission.

## 2018-04-04 NOTE — H&P (Signed)
History and Physical      NAME:  David Heath   DOB:   09-23-1970   MRN:   [id number]     Date/Time:  04/04/2018     Chief Complaint: Chest pain      History of Present Illness:        Mr. Earlie Counts is a 47 y.o.   male with a PMH of hypertension and GERD who presents with c/c of chest pain.  Patient had midsternal chest pain, nonradiating not associated with diaphoresis or palpitation.  He denies fever chills.  Denies cough or shortness of breathing.  He had CTA of the chest which was negative for PE his cardiac enzymes showed slightly elevated troponin on the second set.  Patient started on IV heparin drip, cardiology has been consulted and admitted for further evaluation and management.      Past Medical History:   Diagnosis Date   ??? Gastrointestinal disorder         No past surgical history on file.    Social History     Tobacco Use   ??? Smoking status: Current Every Day Smoker     Packs/day: 1.50     Years: 30.00     Pack years: 45.00   ??? Smokeless tobacco: Never Used   Substance Use Topics   ??? Alcohol use: Not Currently        No family history on file.     No Known Allergies     Prior to Admission medications    Medication Sig Start Date End Date Taking? Authorizing Provider   amLODIPine-atorvastatin (CADUET) 10-10 mg per tablet Take 1 Tab by mouth daily. Indications: high blood pressure   Yes Other, Phys, MD   esomeprazole (NEXIUM) 20 mg capsule Take 20 mg by mouth daily. Indications: gastroesophageal reflux disease   Yes Provider, Historical          Review of systems:     CONSTITUTIONAL: No Fever, No chills, No weight loss, No Night sweats  HEENT:  No epistaxis, No diff in swallowing  CVS: No palpitations, No syncope, No peripheral edema, No PND, No orthopnea  RS: No shortness of breath, No cough, No hemoptysis, No pleuritic chest pain  GI: No abd pain, No vomitting, No diarrhea, No hematemesis, No rectal bleeding, No acid reflux or heartburn   NEURO: No focal weakness, No headaches, No seizures  PSYCH: No anxiety, No depression  MUSCULOSKLETAL: No joint pain or swelling  GU: No hematuria or dysuria  SKIN: No rash      Physical Exam:       VITALS:    Vital signs reviewed; most recent are:    Visit Vitals  BP 123/73 (BP 1 Location: Left arm, BP Patient Position: Supine)   Pulse (!) 53   Temp 97.8 ??F (36.6 ??C)   Resp 18   Ht 6' (1.829 m)   Wt 97.1 kg (214 lb)   SpO2 97%   BMI 29.02 kg/m??     SpO2 Readings from Last 6 Encounters:   04/04/18 97%            Intake/Output Summary (Last 24 hours) at 04/04/2018 1409  Last data filed at 04/04/2018 1037  Gross per 24 hour   Intake 550 ml   Output ???   Net 550 ml        GENERAL: Not in acute distress  HEENT: pink conjunctiva, un icteric sclera,   NECK: No lymphadenopthy or thyroid swelling, JVD not seen  LYMPH: No supraclavicular or cervical or axillary nodes on both sides  CVS: S1S2, No murmurs, No gallop or rub  RS: CTA, No wheezing or crackles  Abd: Soft, non tender, not distended, No guarding, No rigidity  NEURO:  No focal neurologic deficits   Extrm: no leg edema or swelling   Skin: No rash      Labs:     Recent Results (from the past 24 hour(s))   CBC WITH AUTOMATED DIFF    Collection Time: 04/04/18  8:04 AM   Result Value Ref Range    WBC 13.0 (H) 4.0 - 11.0 1000/mm3    RBC 4.86 3.80 - 5.70 M/uL    HGB 14.6 12.4 - 17.2 gm/dl    HCT 42.0 37.0 - 50.0 %    MCV 86.4 80.0 - 98.0 fL    MCH 30.0 23.0 - 34.6 pg    MCHC 34.8 30.0 - 36.0 gm/dl    PLATELET 189 140 - 450 1000/mm3    MPV 10.6 (H) 6.0 - 10.0 fL    RDW-SD 41.1 35.1 - 43.9      NRBC 0 0 - 0      IMMATURE GRANULOCYTES 0.6 0.0 - 3.0 %    NEUTROPHILS 70.0 (H) 34 - 64 %    LYMPHOCYTES 21.9 (L) 28 - 48 %    MONOCYTES 5.7 1 - 13 %    EOSINOPHILS 1.5 0 - 5 %    BASOPHILS 0.3 0 - 3 %   METABOLIC PANEL, COMPREHENSIVE    Collection Time: 04/04/18  8:04 AM   Result Value Ref Range    Sodium 136 136 - 145 mEq/L    Potassium 3.8 3.5 - 5.1 mEq/L     Chloride 104 98 - 107 mEq/L    CO2 22 21 - 32 mEq/L    Glucose 146 (H) 74 - 106 mg/dl    BUN 14 7 - 25 mg/dl    Creatinine 1.0 0.6 - 1.3 mg/dl    GFR est AA >60      GFR est non-AA >60      Calcium 9.1 8.5 - 10.1 mg/dl    AST (SGOT) 9 (L) 15 - 37 U/L    ALT (SGPT) 29 12 - 78 U/L    Alk. phosphatase 89 45 - 117 U/L    Bilirubin, total 0.4 0.2 - 1.0 mg/dl    Protein, total 7.7 6.4 - 8.2 gm/dl    Albumin 4.0 3.4 - 5.0 gm/dl    Anion gap 10 5 - 15 mmol/L   TROPONIN I    Collection Time: 04/04/18  8:04 AM   Result Value Ref Range    Troponin-I <0.015 0.000 - 0.045 ng/ml   MAGNESIUM    Collection Time: 04/04/18  8:04 AM   Result Value Ref Range    Magnesium 1.5 (L) 1.6 - 2.6 mg/dl   POC CREATININE    Collection Time: 04/04/18  8:04 AM   Result Value Ref Range    Creatinine 0.9 0.6 - 1.3 mg/dl   PTT    Collection Time: 04/04/18  8:04 AM   Result Value Ref Range    aPTT 28.2 25.1 - 36.5 seconds   TROPONIN I    Collection Time: 04/04/18 11:14 AM   Result Value Ref Range    Troponin-I 0.109 (H) 0.000 - 0.045 ng/ml         Active Problems:    Hypomagnesemia (04/04/2018)      Chest pain (04/04/2018)  Elevated troponin (04/04/2018)        Assessment:       1. Chest pain  2. Elevated troponin  3. Hypertension  4. GERD    Plan:       ?? Patient admitted to telemetry unit  ?? Cardiology already consulted  ?? Continue IV heparin drip  ?? IV Dilaudid for pain  ?? Discussed with the patient and family at bedside  ?? Full CODE STATUS    Total time:  57 minutes        _______________________________________________________________________        Attending Physician: Stanton Kidney, MD       Copies: Lacey Jensen, MD

## 2018-04-05 LAB — EKG, 12 LEAD, INITIAL
Atrial Rate: 49 {beats}/min
Atrial Rate: 55 {beats}/min
Atrial Rate: 56 {beats}/min
Calculated P Axis: 45 degrees
Calculated P Axis: 51 degrees
Calculated P Axis: 55 degrees
Calculated R Axis: 34 degrees
Calculated R Axis: 46 degrees
Calculated R Axis: 53 degrees
Calculated T Axis: 15 degrees
Calculated T Axis: 32 degrees
Calculated T Axis: 40 degrees
P-R Interval: 168 ms
P-R Interval: 178 ms
P-R Interval: 178 ms
Q-T Interval: 410 ms
Q-T Interval: 416 ms
Q-T Interval: 440 ms
QRS Duration: 108 ms
QRS Duration: 90 ms
QRS Duration: 90 ms
QTC Calculation (Bezet): 395 ms
QTC Calculation (Bezet): 397 ms
QTC Calculation (Bezet): 397 ms
Ventricular Rate: 49 {beats}/min
Ventricular Rate: 55 {beats}/min
Ventricular Rate: 56 {beats}/min

## 2018-04-05 LAB — MAGNESIUM
Magnesium: 2.2 mg/dl (ref 1.6–2.6)
Magnesium: 2.2 mg/dl (ref 1.6–2.6)

## 2018-04-05 LAB — METABOLIC PANEL, BASIC
Anion gap: 7 mmol/L (ref 5–15)
BUN: 13 mg/dl (ref 7–25)
CO2: 23 mEq/L (ref 21–32)
Calcium: 8.3 mg/dl — ABNORMAL LOW (ref 8.5–10.1)
Chloride: 110 mEq/L — ABNORMAL HIGH (ref 98–107)
Creatinine: 0.9 mg/dl (ref 0.6–1.3)
GFR est AA: >60
GFR est non-AA: >60
Glucose: 143 mg/dl — ABNORMAL HIGH (ref 74–106)
Potassium: 3.8 mEq/L (ref 3.5–5.1)
Sodium: 141 mEq/L (ref 136–145)

## 2018-04-05 LAB — CBC WITH AUTOMATED DIFF
BASOPHILS: 0.5 % (ref 0–3)
EOSINOPHILS: 1.5 % (ref 0–5)
HCT: 38.6 % (ref 37.0–50.0)
HGB: 13.4 gm/dl (ref 12.4–17.2)
IMMATURE GRANULOCYTES: 0.6 % (ref 0.0–3.0)
LYMPHOCYTES: 24.7 % — ABNORMAL LOW (ref 28–48)
MCH: 30.2 pg (ref 23.0–34.6)
MCHC: 34.7 gm/dl (ref 30.0–36.0)
MCV: 86.9 fL (ref 80.0–98.0)
MONOCYTES: 6.9 % (ref 1–13)
MPV: 11.2 fL — ABNORMAL HIGH (ref 6.0–10.0)
NEUTROPHILS: 65.8 % — ABNORMAL HIGH (ref 34–64)
NRBC: 0 (ref 0–0)
PLATELET: 188 10*3/uL (ref 140–450)
RBC: 4.44 M/uL (ref 3.80–5.70)
RDW-SD: 42.4 (ref 35.1–43.9)
WBC: 12.2 10*3/uL — ABNORMAL HIGH (ref 4.0–11.0)

## 2018-04-05 LAB — LIPID PANEL
CHOL/HDL Ratio: 7.5 Ratio — ABNORMAL HIGH (ref 0.0–5.0)
Chol/HDL Ratio: 7.5 Ratio — ABNORMAL HIGH (ref 0.0–5.0)
Cholesterol, Total: 188 mg/dl (ref 140–199)
Cholesterol, total: 188 mg/dl (ref 140–199)
HDL Cholesterol: 25 mg/dl — ABNORMAL LOW (ref 40–96)
HDL: 25 mg/dl — ABNORMAL LOW (ref 40–96)
LDL Calculated: 123 mg/dl (ref 0–130)
LDL, calculated: 123 mg/dl (ref 0–130)
Triglyceride: 202 mg/dl — ABNORMAL HIGH (ref 29–150)
Triglycerides: 202 mg/dl — ABNORMAL HIGH (ref 29–150)

## 2018-04-05 LAB — HEMOGLOBIN A1C W/O EAG
Hemoglobin A1C: 5.7 % (ref 4.2–6.3)
Hemoglobin A1c: 5.7 % (ref 4.2–6.3)

## 2018-04-05 LAB — TROPONIN I: Troponin-I: 39.3 ng/ml — CR (ref 0.000–0.045)

## 2018-04-05 LAB — BASIC METABOLIC PANEL
Anion Gap: 7 mmol/L (ref 5–15)
BUN: 13 mg/dl (ref 7–25)
CO2: 23 mEq/L (ref 21–32)
Calcium: 8.3 mg/dl — ABNORMAL LOW (ref 8.5–10.1)
Chloride: 110 mEq/L — ABNORMAL HIGH (ref 98–107)
Creatinine: 0.9 mg/dl (ref 0.6–1.3)
EGFR IF NonAfrican American: >60
GFR African American: >60
Glucose: 143 mg/dl — ABNORMAL HIGH (ref 74–106)
Potassium: 3.8 mEq/L (ref 3.5–5.1)
Sodium: 141 mEq/L (ref 136–145)

## 2018-04-05 LAB — EKG 12-LEAD
Atrial Rate: 49 {beats}/min
Atrial Rate: 55 {beats}/min
Atrial Rate: 56 {beats}/min
P Axis: 45 degrees
P Axis: 51 degrees
P Axis: 55 degrees
P-R Interval: 168 ms
P-R Interval: 178 ms
P-R Interval: 178 ms
Q-T Interval: 410 ms
Q-T Interval: 416 ms
Q-T Interval: 440 ms
QRS Duration: 108 ms
QRS Duration: 90 ms
QRS Duration: 90 ms
QTc Calculation (Bazett): 395 ms
QTc Calculation (Bazett): 397 ms
QTc Calculation (Bazett): 397 ms
R Axis: 34 degrees
R Axis: 46 degrees
R Axis: 53 degrees
T Axis: 15 degrees
T Axis: 32 degrees
T Axis: 40 degrees
Ventricular Rate: 49 {beats}/min
Ventricular Rate: 55 {beats}/min
Ventricular Rate: 56 {beats}/min

## 2018-04-05 LAB — CBC WITH AUTO DIFFERENTIAL
Basophils %: 0.5 % (ref 0–3)
Eosinophils %: 1.5 % (ref 0–5)
Hematocrit: 38.6 % (ref 37.0–50.0)
Hemoglobin: 13.4 gm/dl (ref 12.4–17.2)
Immature Granulocytes: 0.6 % (ref 0.0–3.0)
Lymphocytes %: 24.7 % — ABNORMAL LOW (ref 28–48)
MCH: 30.2 pg (ref 23.0–34.6)
MCHC: 34.7 gm/dl (ref 30.0–36.0)
MCV: 86.9 fL (ref 80.0–98.0)
MPV: 11.2 fL — ABNORMAL HIGH (ref 6.0–10.0)
Monocytes %: 6.9 % (ref 1–13)
Neutrophils %: 65.8 % — ABNORMAL HIGH (ref 34–64)
Nucleated RBCs: 0 (ref 0–0)
Platelets: 188 10*3/uL (ref 140–450)
RBC: 4.44 M/uL (ref 3.80–5.70)
RDW-SD: 42.4 (ref 35.1–43.9)
WBC: 12.2 10*3/uL — ABNORMAL HIGH (ref 4.0–11.0)

## 2018-04-05 LAB — TROPONIN: Troponin I: 39.3 ng/ml (ref 0.000–0.045)

## 2018-04-05 MED ORDER — TICAGRELOR 90 MG TAB
90 mg | ORAL_TABLET | Freq: Two times a day (BID) | ORAL | 0 refills | Status: AC
Start: 2018-04-05 — End: ?

## 2018-04-05 MED ORDER — ASPIRIN 81 MG CHEWABLE TAB
81 mg | ORAL_TABLET | Freq: Every day | ORAL | 0 refills | Status: AC
Start: 2018-04-05 — End: ?

## 2018-04-05 MED ORDER — LOSARTAN 25 MG TAB
25 mg | ORAL_TABLET | Freq: Every day | ORAL | 0 refills | Status: AC
Start: 2018-04-05 — End: ?

## 2018-04-05 MED ORDER — ATORVASTATIN 80 MG TAB
80 mg | ORAL_TABLET | Freq: Every evening | ORAL | 0 refills | Status: AC
Start: 2018-04-05 — End: ?

## 2018-04-05 MED ORDER — METOPROLOL TARTRATE 25 MG TAB
25 mg | ORAL_TABLET | Freq: Two times a day (BID) | ORAL | 0 refills | Status: AC
Start: 2018-04-05 — End: ?

## 2018-04-05 MED FILL — ASPIRIN 81 MG CHEWABLE TAB: 81 mg | ORAL | Qty: 1

## 2018-04-05 MED FILL — ATORVASTATIN 40 MG TAB: 40 mg | ORAL | Qty: 2

## 2018-04-05 MED FILL — METOPROLOL TARTRATE 25 MG TAB: 25 mg | ORAL | Qty: 1

## 2018-04-05 MED FILL — BRILINTA 90 MG TABLET: 90 mg | ORAL | Qty: 1

## 2018-04-05 MED FILL — FLUCELVAX QUAD 2019-2020 (PF) 60 MCG (15 MCG X 4)/0.5 ML IM SYRINGE: 60 mcg (15 mcg x 4)/0.5 mL | INTRAMUSCULAR | Qty: 0.5

## 2018-04-05 MED FILL — BD POSIFLUSH NORMAL SALINE 0.9 % INJECTION SYRINGE: INTRAMUSCULAR | Qty: 10

## 2018-04-05 MED FILL — INTEGRILIN 0.75 MG/ML INTRAVENOUS SOLUTION: 0.75 mg/mL | INTRAVENOUS | Qty: 100

## 2018-04-05 MED FILL — LOSARTAN 25 MG TAB: 25 mg | ORAL | Qty: 1

## 2018-04-05 MED FILL — NICOTINE 14 MG/24 HR DAILY PATCH: 14 mg/24 hr | TRANSDERMAL | Qty: 1

## 2018-04-05 NOTE — Progress Notes (Addendum)
[facility] ([facility]   CARDIOLOGY PROGRESS NOTE    PCP: Dr. Assunta Found,  [facility],  [place], [place]      Fax [phone]  ??  Recommendations:   cardiac catheterization / PCI yesterday - as below  reinforced DAPT compliance  A1c and lipid panel and TSH for further risk stratification  Nicotine patch and smoking cessation reinforced  Will see PCP back in [place], [place] for referral to local Cardiologist and for Cardiac Rehab.    ??  Impression:   NSTEMI with troponin  <0.015 => 0.109 => 39.3  Chest Pain at rest--severe crushing sharp and dull to shoulder              - no acute ECG changes  Cardiac Cath / PCI 04/04/2018, Dr. Rosario Jacks   Hemodynamics   LVEDP 25   LV 125//25   Ao 125/78  ?? LVEF 60%  ?? Cors: Right dominant   LM ok   LAD 40% irreg, 60% small Diag - plan MedRx   RI moderate 100% prox-culprit   LCx nondom 20-30% irregs, NSD   RCA large dom 20-40% irregs, NSD  ??  PCI of RI 100% occlusion:   6 Fr EBU 3.5, Runthrough, heparin, Integrilin  ??  Pre dilated 2x8   Stent ONYX 2.5x18 DES   Post 2.5x12 [place] to rated  ??  0% residual TIMI III flow  Hypertension  HLD, currently being treated with diet and exercise   calcLDL 123   Added atorvastatin   Target LDL < 70  GERD  Tobacco abuse/dependence - advised cesssation        SUBJECTIVE:  No CP or SOB    VS:   Visit Vitals  BP 122/72 (BP 1 Location: Left arm, BP Patient Position: Supine)   Pulse 62   Temp 97.7 ??F (36.5 ??C)   Resp 20   Ht 6' (1.829 m)   Wt 96.5 kg (212 lb 11.9 oz)   SpO2 96%   BMI 28.85 kg/m??       Intake/Output Summary (Last 24 hours) at 04/05/2018 0856  Last data filed at 04/04/2018 1548  Gross per 24 hour   Intake 550 ml   Output 550 ml   Net 0 ml     TELE personally reviewed by me:  NSR    EXAM:  General:  Skin warm, perfusion adequate  Neck: JVD is absent  Lungs: clear, no rales, no rhonchi   Cardiac:  Regular Rhythm, No murmur, Gallops or Rubs  Abdomen: soft, nl bowel sounds  Ext: Edema is absent   Neuro: Alert and oriented; Nonfocal    Labs:   Ref. Range 04/05/2018 02:09   Triglyceride Latest Ref Range: 29 - 150 mg/dl 657 (H)   Cholesterol, total Latest Ref Range: 140 - 199 mg/dl 846   HDL Cholesterol Latest Ref Range: 40 - 96 mg/dl 25 (L)   CHOL/HDL Ratio Latest Ref Range: 0.0 - 5.0 Ratio 7.5 (H)   LDL, calculated Latest Ref Range: 0 - 130 mg/dl 962       Basic Metabolic Profile   Lab Results   Component Value Date/Time    NA 141 04/05/2018 02:09 AM    NA 136 04/04/2018 08:04 AM    K 3.8 04/05/2018 02:09 AM    K 3.8 04/04/2018 08:04 AM    CL 110 (H) 04/05/2018 02:09 AM    CL 104 04/04/2018 08:04 AM    CO2 23 04/05/2018 02:09 AM    CO2  22 04/04/2018 08:04 AM    BUN 13 04/05/2018 02:09 AM    BUN 14 04/04/2018 08:04 AM    CREA 0.9 04/05/2018 02:09 AM    CREA 1.0 04/04/2018 08:04 AM    CREA 0.9 04/04/2018 08:04 AM    GLU 143 (H) 04/05/2018 02:09 AM    GLU 146 (H) 04/04/2018 08:04 AM    CA 8.3 (L) 04/05/2018 02:09 AM    CA 9.1 04/04/2018 08:04 AM    MG 2.2 04/05/2018 02:09 AM    MG 1.5 (L) 04/04/2018 08:04 AM        CBC w/Diff    Lab Results   Component Value Date/Time    WBC 12.2 (H) 04/05/2018 02:09 AM    WBC 13.0 (H) 04/04/2018 08:04 AM    RBC 4.44 04/05/2018 02:09 AM    RBC 4.86 04/04/2018 08:04 AM    HGB 13.4 04/05/2018 02:09 AM    HGB 14.6 04/04/2018 08:04 AM    HCT 38.6 04/05/2018 02:09 AM    HCT 42.0 04/04/2018 08:04 AM    MCV 86.9 04/05/2018 02:09 AM    MCV 86.4 04/04/2018 08:04 AM    MCH 30.2 04/05/2018 02:09 AM    MCH 30.0 04/04/2018 08:04 AM    MCHC 34.7 04/05/2018 02:09 AM    MCHC 34.8 04/04/2018 08:04 AM    PLT 188 04/05/2018 02:09 AM    PLT 189 04/04/2018 08:04 AM    Lab Results   Component Value Date/Time    GRANS 65.8 (H) 04/05/2018 02:09 AM    GRANS 70.0 (H) 04/04/2018 08:04 AM    LYMPH 24.7 (L) 04/05/2018 02:09 AM    LYMPH 21.9 (L) 04/04/2018 08:04 AM    MONOS 6.9 04/05/2018 02:09 AM    MONOS 5.7 04/04/2018 08:04 AM    EOS 1.5 04/05/2018 02:09 AM    EOS 1.5 04/04/2018 08:04 AM     BASOS 0.5 04/05/2018 02:09 AM    BASOS 0.3 04/04/2018 08:04 AM        Cardiac Enzymes      Ref. Range 04/04/2018 08:04 04/04/2018 08:29 04/04/2018 11:00 04/04/2018 11:14      04/05/2018 01:50   Troponin-I Latest Ref Range: 0.000 - 0.045 ng/ml <0.015   0.109 (H)      39.300 (HH)     Lab Results   Component Value Date/Time    HGB 13.4 04/05/2018 02:09 AM    HGB 14.6 04/04/2018 08:04 AM     Lab Results   Component Value Date/Time    HCT 38.6 04/05/2018 02:09 AM    HCT 42.0 04/04/2018 08:04 AM       Medications Reviewed:  Current Facility-Administered Medications   Medication Dose Route Frequency   ??? influenza vaccine 2019-20 (4 yrs+)(PF) (FLUCELVAX QUAD) injection 0.5 mL  0.5 mL IntraMUSCular PRIOR TO DISCHARGE   ??? sodium chloride (NS) flush 5-10 mL  5-10 mL IntraVENous Q8H   ??? atorvastatin (LIPITOR) tablet 80 mg  80 mg Oral QHS   ??? metoprolol tartrate (LOPRESSOR) tablet 12.5 mg  12.5 mg Oral Q12H   ??? nicotine (NICODERM CQ) 14 mg/24 hr patch 1 Patch  1 Patch TransDERmal DAILY   ??? aspirin chewable tablet 81 mg  81 mg Oral DAILY   ??? ticagrelor (BRILINTA) tablet 90 mg  90 mg Oral BID   ??? losartan (COZAAR) tablet 25 mg  25 mg Oral DAILY   ??? [START ON 04/06/2018] remove NICOTINE patch reminder  1 Patch Topical Q24H  Saylee Sherrill C. Dian SituAshby, Jr, MD, FACC, FCCP  [facility], [facility]  Phone:  [phone]    04/05/2018  8:56 AM

## 2018-04-05 NOTE — Discharge Summary (Signed)
Discharge Summary      Patient Name: David Heath  Medical Record Number: [id number]  Date of Birth: 10/29/1970  Discharge Provider: Theodis Aguas, MD  Primary Care Provider: Other, Phys, MD    Admit date: 04/04/2018  Discharge date: 04/05/2018  Discharge Disposition: Home.  Code Status: Prior        Follow-up appointments:     Follow-up Information     Follow up With Specialties Details Why Contact Info    Other, Phys, MD  In 1 week patient to make their own app't  patient has information of doctor at home. Patient can only remember the practice name and not the physician             Follow-up recommendations:     Follow up with PCP in 1 week      Discharge diagnosis:      1. NSTEMI  2. CAD status post PCI of RI 100% occlusion  3. Hypertension  4. GERD    Discharge Medications:      Discharge Medication List as of 04/05/2018 10:47 AM      START taking these medications    Details   aspirin 81 mg chewable tablet Take 1 Tab by mouth daily., Print, Disp-30 Tab, R-0      atorvastatin (LIPITOR) 80 mg tablet Take 1 Tab by mouth nightly., Print, Disp-30 Tab, R-0      losartan (COZAAR) 25 mg tablet Take 1 Tab by mouth daily., Print, Disp-30 Tab, R-0      metoprolol tartrate (LOPRESSOR) 25 mg tablet Take 0.5 Tabs by mouth every twelve (12) hours., Print, Disp-60 Tab, R-0      ticagrelor (BRILINTA) 90 mg tablet Take 1 Tab by mouth two (2) times a day., Print, Disp-60 Tab, R-0         CONTINUE these medications which have NOT CHANGED    Details   esomeprazole (NEXIUM) 20 mg capsule Take 20 mg by mouth daily. Indications: gastroesophageal reflux disease, Historical Med         STOP taking these medications       amLODIPine-atorvastatin (CADUET) 10-10 mg per tablet Comments:   Reason for Stopping:               Discharge Diet: Cardiac Diet    Consultants: Cardiology    Procedures:   * No surgery found *  Cardiology and IR Orders (From admission to next 24h)     Start     Ordered    04/04/18 1445  CARDIAC CATHETERIZATION  [[id number]]   ONE TIME      04/04/18 1434    04/04/18 1445  CARDIAC PROCEDURE  [[id number]]  ONE TIME      04/04/18 1434                History of presenting illness: Per admitting physician     David Heath is a 47 y.o.   male with a PMH of hypertension and GERD who presents with c/c of chest pain.  Patient had midsternal chest pain, nonradiating not associated with diaphoresis or palpitation.  He denies fever chills.  Denies cough or shortness of breathing.  He had CTA of the chest which was negative for PE his cardiac enzymes showed slightly elevated troponin on the second set.  Patient started on IV heparin drip, cardiology has been consulted and admitted for further evaluation and management.  ??  Hospital course:      Patient was admitted to telemetry unit  Cardiology was consulted  Patient underwent cardiac catheterization that showed under percent occlusion to RI status post PCI.  Patient will continue double antiplatelet therapy, continue statin, beta-blocker and ARB  Patient will be discharged home with outpatient follow-up with cardiology  Physical exam:  General:   Not in acute distress  HEENT: PERRLA, Neck Supple,  No JVD  Respiratory:   CTA bilaterally-no wheezes, rales, rhonchi, or crackles  Cardiac:  Regular Rate and Rythmn  - no murmurs, rubs or gallops  Abdominal:  Soft, non-tender, non-distended, positive bowel sounds  Extremities:  No cyanosis, or edema.  Skin: No rash  Neurological:  No focal neurological deficits    Discharge condition: Good    Discharge activity and restrictions: Activity as tolerated    Time spent with discharging patient: 40 minutes      Theodis AguasYared G Aubree Doody, MD  04/05/2018  3:00 PM    Copies: Sol Blazingther, Phys, MD

## 2018-04-05 NOTE — Progress Notes (Signed)
Patient discharged home with no needs.  Patient declines any home health needs at this time.  Son, Brock RaJoseph Belnap, Montez HagemanJr., to pick up patient for discharge.  Call placed to patient's pharmacy - [facility] [place], [place] - to confirm that they have the Brilinta in stock.  $5.00 copay card provided to the patient.      Discharge Plan:   Home with no needs    Discharge Date:     04/05/2018     Assisted Living Facility:    N/A    The facility confirmed that they are ready to receive the patient:     Yes/No; the CM spoke with     N/A    Does the patient have  an insurance company assigned cm   Yes/NO    Spoke with case manager:  No    Collaborative dc plan :  N/A    Is patient going to snf?:  No     Home Health Needed:     Declined    DME needed and ordered for Discharge:    None    TCC Referral:     Yes - Cardiac Rehab    Medication Assistance given     Y/N       meds given (please list)     See above    Change(MDC/SSDI) Referral/outcome    N/A     Transportation: Family/ Medical    family

## 2018-04-05 NOTE — Progress Notes (Signed)
Went over discharge with patient.   New medications and follow up appointments reviewed.     Any questions were answered.   Scripts if available were given to the patient/Transport.    Pt/family are aware of changes.   Discharge checklist is with patient.

## 2018-04-05 NOTE — Progress Notes (Signed)
[facility] ([facility]   CARDIOLOGY PROGRESS NOTE    PCP: Dr. Assunta Found,  [facility],  [place], [place]      Fax [phone]  ??  Recommendations:   cardiac catheterization / PCI yesterday - as below  reinforced DAPT compliance  A1c and lipid panel and TSH for further risk stratification  Nicotine patch and smoking cessation reinforced  Will see PCP back in [place], [place] for referral to local Cardiologist and for Cardiac Rehab.    ??  Impression:   NSTEMI with troponin  <0.015 => 0.109 => 39.3  Chest Pain at rest--severe crushing sharp and dull to shoulder              - no acute ECG changes  Cardiac Cath / PCI 04/04/2018, Dr. Rosario Jacks   Hemodynamics   LVEDP 25   LV 125//25   Ao 125/78  ?? LVEF 60%  ?? Cors: Right dominant   LM ok   LAD 40% irreg, 60% small Diag - plan MedRx   RI moderate 100% prox-culprit   LCx nondom 20-30% irregs, NSD   RCA large dom 20-40% irregs, NSD  ??  PCI of RI 100% occlusion:   6 Fr EBU 3.5, Runthrough, heparin, Integrilin  ??  Pre dilated 2x8   Stent ONYX 2.5x18 DES   Post 2.5x12 NC to rated  ??  0% residual TIMI III flow  Hypertension  HLD, currently being treated with diet and exercise   calcLDL 123   Added atorvastatin   Target LDL < 70  GERD  Tobacco abuse/dependence - advised cesssation        SUBJECTIVE:  No CP or SOB    VS:   Visit Vitals  BP 122/72 (BP 1 Location: Left arm, BP Patient Position: Supine)   Pulse 62   Temp 97.7 ??F (36.5 ??C)   Resp 20   Ht 6' (1.829 m)   Wt 96.5 kg (212 lb 11.9 oz)   SpO2 96%   BMI 28.85 kg/m??       Intake/Output Summary (Last 24 hours) at 04/05/2018 0856  Last data filed at 04/04/2018 1548  Gross per 24 hour   Intake 550 ml   Output 550 ml   Net 0 ml     TELE personally reviewed by me:  NSR    EXAM:  General:  Skin warm, perfusion adequate  Neck: JVD is absent  Lungs: clear, no rales, no rhonchi   Cardiac:  Regular Rhythm, No murmur, Gallops or Rubs  Abdomen: soft, nl bowel sounds  Ext: Edema is absent  Neuro: Alert and oriented;  Nonfocal    Labs:   Ref. Range 04/05/2018 02:09   Triglyceride Latest Ref Range: 29 - 150 mg/dl 332 (H)   Cholesterol, total Latest Ref Range: 140 - 199 mg/dl 951   HDL Cholesterol Latest Ref Range: 40 - 96 mg/dl 25 (L)   CHOL/HDL Ratio Latest Ref Range: 0.0 - 5.0 Ratio 7.5 (H)   LDL, calculated Latest Ref Range: 0 - 130 mg/dl 884       Basic Metabolic Profile   Lab Results   Component Value Date/Time    NA 141 04/05/2018 02:09 AM    NA 136 04/04/2018 08:04 AM    K 3.8 04/05/2018 02:09 AM    K 3.8 04/04/2018 08:04 AM    CL 110 (H) 04/05/2018 02:09 AM    CL 104 04/04/2018 08:04 AM    CO2 23 04/05/2018 02:09 AM    CO2  22 04/04/2018 08:04 AM    BUN 13 04/05/2018 02:09 AM    BUN 14 04/04/2018 08:04 AM    CREA 0.9 04/05/2018 02:09 AM    CREA 1.0 04/04/2018 08:04 AM    CREA 0.9 04/04/2018 08:04 AM    GLU 143 (H) 04/05/2018 02:09 AM    GLU 146 (H) 04/04/2018 08:04 AM    CA 8.3 (L) 04/05/2018 02:09 AM    CA 9.1 04/04/2018 08:04 AM    MG 2.2 04/05/2018 02:09 AM    MG 1.5 (L) 04/04/2018 08:04 AM        CBC w/Diff    Lab Results   Component Value Date/Time    WBC 12.2 (H) 04/05/2018 02:09 AM    WBC 13.0 (H) 04/04/2018 08:04 AM    RBC 4.44 04/05/2018 02:09 AM    RBC 4.86 04/04/2018 08:04 AM    HGB 13.4 04/05/2018 02:09 AM    HGB 14.6 04/04/2018 08:04 AM    HCT 38.6 04/05/2018 02:09 AM    HCT 42.0 04/04/2018 08:04 AM    MCV 86.9 04/05/2018 02:09 AM    MCV 86.4 04/04/2018 08:04 AM    MCH 30.2 04/05/2018 02:09 AM    MCH 30.0 04/04/2018 08:04 AM    MCHC 34.7 04/05/2018 02:09 AM    MCHC 34.8 04/04/2018 08:04 AM    PLT 188 04/05/2018 02:09 AM    PLT 189 04/04/2018 08:04 AM    Lab Results   Component Value Date/Time    GRANS 65.8 (H) 04/05/2018 02:09 AM    GRANS 70.0 (H) 04/04/2018 08:04 AM    LYMPH 24.7 (L) 04/05/2018 02:09 AM    LYMPH 21.9 (L) 04/04/2018 08:04 AM    MONOS 6.9 04/05/2018 02:09 AM    MONOS 5.7 04/04/2018 08:04 AM    EOS 1.5 04/05/2018 02:09 AM    EOS 1.5 04/04/2018 08:04 AM    BASOS 0.5 04/05/2018 02:09 AM    BASOS 0.3  04/04/2018 08:04 AM        Cardiac Enzymes      Ref. Range 04/04/2018 08:04 04/04/2018 08:29 04/04/2018 11:00 04/04/2018 11:14      04/05/2018 01:50   Troponin-I Latest Ref Range: 0.000 - 0.045 ng/ml <0.015   0.109 (H)      39.300 (HH)     Lab Results   Component Value Date/Time    HGB 13.4 04/05/2018 02:09 AM    HGB 14.6 04/04/2018 08:04 AM     Lab Results   Component Value Date/Time    HCT 38.6 04/05/2018 02:09 AM    HCT 42.0 04/04/2018 08:04 AM       Medications Reviewed:  Current Facility-Administered Medications   Medication Dose Route Frequency   ??? influenza vaccine 2019-20 (4 yrs+)(PF) (FLUCELVAX QUAD) injection 0.5 mL  0.5 mL IntraMUSCular PRIOR TO DISCHARGE   ??? sodium chloride (NS) flush 5-10 mL  5-10 mL IntraVENous Q8H   ??? atorvastatin (LIPITOR) tablet 80 mg  80 mg Oral QHS   ??? metoprolol tartrate (LOPRESSOR) tablet 12.5 mg  12.5 mg Oral Q12H   ??? nicotine (NICODERM CQ) 14 mg/24 hr patch 1 Patch  1 Patch TransDERmal DAILY   ??? aspirin chewable tablet 81 mg  81 mg Oral DAILY   ??? ticagrelor (BRILINTA) tablet 90 mg  90 mg Oral BID   ??? losartan (COZAAR) tablet 25 mg  25 mg Oral DAILY   ??? [START ON 04/06/2018] remove NICOTINE patch reminder  1 Patch Topical Q24H  Aliannah Holstrom C. Dian Situ, MD, FACC, FCCP  [facility], [facility]  Phone:  [phone]    04/05/2018  8:56 AM

## 2018-04-05 NOTE — Progress Notes (Signed)
Patient discharged home with no needs.  Patient declines any home health needs at this time.  Son, Raymel Johnstone, Jr., to pick up patient for discharge.  Call placed to patient's pharmacy - [facility] in [place], [place] - to confirm that they have the Brilinta in stock.  $5.00 copay card provided to the patient.      Discharge Plan:   Home with no needs    Discharge Date:     04/05/2018     Assisted Living Facility:    N/A    The facility confirmed that they are ready to receive the patient:     Yes/No; the CM spoke with     N/A    Does the patient have  an insurance company assigned cm   Yes/NO    Spoke with case manager:  No    Collaborative dc plan :  N/A    Is patient going to snf?:  No     Home Health Needed:     Declined    DME needed and ordered for Discharge:    None    TCC Referral:     Yes - Cardiac Rehab    Medication Assistance given     Y/N       meds given (please list)     See above    Change(MDC/SSDI) Referral/outcome    N/A     Transportation: Family/ Medical    family

## 2018-04-05 NOTE — Discharge Summary (Signed)
Discharge Summary      Patient Name: David Heath  Medical Record Number: [id number]  Date of Birth: 1970-12-15  Discharge Provider: Theodis AguasYared G Laquenta Whitsell, MD  Primary Care Provider: Other, Phys, MD    Admit date: 04/04/2018  Discharge date: 04/05/2018  Discharge Disposition: Home.  Code Status: Prior        Follow-up appointments:     Follow-up Information     Follow up With Specialties Details Why Contact Info    Other, Phys, MD  In 1 week patient to make their own app't  patient has information of doctor at home. Patient can only remember the practice name and not the physician             Follow-up recommendations:     Follow up with PCP in 1 week      Discharge diagnosis:      1. NSTEMI  2. CAD status post PCI of RI 100% occlusion  3. Hypertension  4. GERD    Discharge Medications:      Discharge Medication List as of 04/05/2018 10:47 AM      START taking these medications    Details   aspirin 81 mg chewable tablet Take 1 Tab by mouth daily., Print, Disp-30 Tab, R-0      atorvastatin (LIPITOR) 80 mg tablet Take 1 Tab by mouth nightly., Print, Disp-30 Tab, R-0      losartan (COZAAR) 25 mg tablet Take 1 Tab by mouth daily., Print, Disp-30 Tab, R-0      metoprolol tartrate (LOPRESSOR) 25 mg tablet Take 0.5 Tabs by mouth every twelve (12) hours., Print, Disp-60 Tab, R-0      ticagrelor (BRILINTA) 90 mg tablet Take 1 Tab by mouth two (2) times a day., Print, Disp-60 Tab, R-0         CONTINUE these medications which have NOT CHANGED    Details   esomeprazole (NEXIUM) 20 mg capsule Take 20 mg by mouth daily. Indications: gastroesophageal reflux disease, Historical Med         STOP taking these medications       amLODIPine-atorvastatin (CADUET) 10-10 mg per tablet Comments:   Reason for Stopping:               Discharge Diet: Cardiac Diet    Consultants: Cardiology    Procedures:   * No surgery found *  Cardiology and IR Orders (From admission to next 24h)     Start     Ordered     04/04/18 1445  CARDIAC CATHETERIZATION  [[id number]]  ONE TIME      04/04/18 1434    04/04/18 1445  CARDIAC PROCEDURE  [[id number]][[id number]]  ONE TIME      04/04/18 1434                History of presenting illness: Per admitting physician     [name] is a 47 y.o.   male with a PMH of hypertension and GERD who presents with c/c of chest pain.  Patient had midsternal chest pain, nonradiating not associated with diaphoresis or palpitation.  He denies fever chills.  Denies cough or shortness of breathing.  He had CTA of the chest which was negative for PE his cardiac enzymes showed slightly elevated troponin on the second set.  Patient started on IV heparin drip, cardiology has been consulted and admitted for further evaluation and management.  ??  Hospital course:      Patient was admitted to telemetry unit  Cardiology was consulted  Patient underwent cardiac catheterization that showed under percent occlusion to RI status post PCI.  Patient will continue double antiplatelet therapy, continue statin, beta-blocker and ARB  Patient will be discharged home with outpatient follow-up with cardiology  Physical exam:  General:   Not in acute distress  HEENT: PERRLA, Neck Supple,  No JVD  Respiratory:   CTA bilaterally-no wheezes, rales, rhonchi, or crackles  Cardiac:  Regular Rate and Rythmn  - no murmurs, rubs or gallops  Abdominal:  Soft, non-tender, non-distended, positive bowel sounds  Extremities:  No cyanosis, or edema.  Skin: No rash  Neurological:  No focal neurological deficits    Discharge condition: Good    Discharge activity and restrictions: Activity as tolerated    Time spent with discharging patient: 40 minutes      Theodis Aguas, MD  04/05/2018  3:00 PM    Copies: Sol Blazing, MD

## 2018-04-05 NOTE — Progress Notes (Signed)
Went over discharge with patient.   New medications and follow up appointments reviewed.     Any questions were answered.   Scripts if available were given to the patient/Transport.    Pt/family are aware of changes.   Discharge checklist is with patient.

## 2018-04-05 NOTE — Other (Signed)
0300: Paged and spoke with PA christensen regarding Troponin T ordered by Dr. Rosario Jacks.  Agreed to order troponin 1 instead and wait for further  clarification during day shift about troponin T.

## 2018-04-05 NOTE — Other (Signed)
Bedside and Verbal shift change report given to Gardiner RamusLillian RN (oncoming nurse) by Florene GlenElkana RN (offgoing nurse). Report included the following information SBAR and Cardiac Rhythm SB.

## 2018-05-23 ENCOUNTER — Encounter: Payer: Self-pay | Admitting: Cardiology

## 2018-05-23 NOTE — Progress Notes (Deleted)
Cardiology Office Note  Date: 05/23/2018   ID: Rodney Baldwin, DOB Nov 27, 1970, MRN [id number]  PCP: Sharilyn Sites, MD  Consulting Cardiologist: Rozann Lesches, MD   No chief complaint on file.   History of Present Illness: Rodney Baldwin is a 47 y.o. male referred for cardiology consultation by Dr. Hilma Favors with recently documented history of CAD and NSTEMI.  I reviewed the available records and updated the chart.  Patient was hospitalized in [place] in September with NSTEMI, peak troponin I level up to 39.  He underwent cardiac catheterization at that time demonstrating overall mild to moderate nonobstructive CAD with the exception of an occluded ramus intermedius that was the culprit lesion.  This was managed with DES intervention, LVEF reported to be 60%.  Past Medical History:  Diagnosis Date  . CAD (coronary artery disease)    Occluded ramus intermedius status post DES September 2019  . GERD (gastroesophageal reflux disease)   . Hypertension   . Laryngitis   . NSTEMI (non-ST elevated myocardial infarction) [facility])    September 2019  . Strep throat   . Tonsillitis     Past Surgical History:  Procedure Laterality Date  . WISDOM TOOTH EXTRACTION      Current Outpatient Medications  Medication Sig Dispense Refill  . amLODipine (NORVASC) 10 MG tablet Take 10 mg by mouth daily.    Marland Kitchen aspirin-acetaminophen-caffeine (EXCEDRIN MIGRAINE) 250-250-65 MG tablet Take 1 tablet by mouth every 6 (six) hours as needed for headache.    . clindamycin (CLEOCIN) 150 MG capsule Take 3 capsules (450 mg total) by mouth 3 (three) times daily. 45 capsule 0  . esomeprazole (NEXIUM) 20 MG capsule Take 20 mg by mouth daily at 12 noon.    Marland Kitchen ibuprofen (ADVIL,MOTRIN) 200 MG tablet Take 800 mg by mouth every 8 (eight) hours as needed. OTC FOR PAIN OR headache     No current facility-administered medications for this visit.    Allergies:  Patient has no known allergies.   Social History:  The patient  reports that he has been smoking cigarettes. He has been smoking about 0.50 packs per day. He has never used smokeless tobacco. He reports that he does not drink alcohol or use drugs.   Family History: The patient's family history includes CAD in his father and mother.   ROS:  Please see the history of present illness. Otherwise, complete review of systems is positive for {NONE DEFAULTED:18576::"none"}.  All other systems are reviewed and negative.   Physical Exam: VS:  There were no vitals taken for this visit., BMI There is no height or weight on file to calculate BMI.  Wt Readings from Last 3 Encounters:  01/14/16 198 lb (89.8 kg)  07/07/15 207 lb (93.9 kg)  04/08/12 214 lb (97.1 kg)    General: Patient appears comfortable at rest. HEENT: Conjunctiva and lids normal, oropharynx clear with moist mucosa. Neck: Supple, no elevated JVP or carotid bruits, no thyromegaly. Lungs: Clear to auscultation, nonlabored breathing at rest. Cardiac: Regular rate and rhythm, no S3 or significant systolic murmur, no pericardial rub. Abdomen: Soft, nontender, no hepatomegaly, bowel sounds present, no guarding or rebound. Extremities: No pitting edema, distal pulses 2+. Skin: Warm and dry. Musculoskeletal: No kyphosis. Neuropsychiatric: Alert and oriented x3, affect grossly appropriate.  ECG: No previous tracing available for review today.  Recent Labwork:  September 2019: Potassium 3.8, BUN 13, creatinine 0.9, cholesterol 188, HDL 25, triglycerides 202, LDL 123, peak troponin I 39.3, AST 9,  ALT 29, hemoglobin 13.4, platelets 188, TSH 1.22  Other Studies Reviewed Today:  Cardiac catheterization and PCI 04/04/2018 ([place]): Hemodynamics LVEDP 25 LV 125//25 Ao 125/78  LVEF 60%  Cors: Right dominant LM ok LAD 40% irreg, 60% small Diag - plan MedRx RI moderate 100% prox-culprit LCx nondom 20-30% irregs, NSD RCA large dom 20-40% irregs, NSD  PCI of RI 100%  occlusion: 6 Fr EBU 3.5, Runthrough, heparin, Integrilin  Pre dilated 2x8 Stent ONYX 2.5x18 DES Post 2.5x12 [place] to rated  0% residual TIMI III flow  Assessment and Plan:   Current medicines were reviewed with the patient today.  No orders of the defined types were placed in this encounter.   Disposition:  Signed, Satira Sark, MD, [facility] 05/23/2018 11:10 AM    [place] at [facility] [address], [place], [place] [address] Phone: [phone]; Fax: [phone]

## 2018-05-26 ENCOUNTER — Ambulatory Visit: Payer: Self-pay | Admitting: Cardiology

## 2018-05-28 ENCOUNTER — Encounter: Payer: Self-pay | Admitting: Cardiology

## 2018-05-28 ENCOUNTER — Ambulatory Visit (INDEPENDENT_AMBULATORY_CARE_PROVIDER_SITE_OTHER): Payer: Managed Care, Other (non HMO) | Admitting: Cardiology

## 2018-05-28 VITALS — BP 152/80 | HR 74 | Ht 72.0 in | Wt 212.0 lb

## 2018-05-28 DIAGNOSIS — E782 Mixed hyperlipidemia: Secondary | ICD-10-CM

## 2018-05-28 DIAGNOSIS — I10 Essential (primary) hypertension: Secondary | ICD-10-CM

## 2018-05-28 DIAGNOSIS — I25119 Atherosclerotic heart disease of native coronary artery with unspecified angina pectoris: Secondary | ICD-10-CM

## 2018-05-28 DIAGNOSIS — I214 Non-ST elevation (NSTEMI) myocardial infarction: Secondary | ICD-10-CM | POA: Diagnosis not present

## 2018-05-28 DIAGNOSIS — F17201 Nicotine dependence, unspecified, in remission: Secondary | ICD-10-CM

## 2018-05-28 NOTE — Patient Instructions (Signed)
Medication Instructions:  Your physician recommends that you continue on your current medications as directed. Please refer to the Current Medication list given to you today.   Labwork: FASTING LIVER & LIPIDS  Testing/Procedures: Your physician has requested that you have en exercise stress myoview. For further information please visit [email or web address]. Please follow instruction sheet, as given.    Follow-Up: Your physician recommends that you schedule a follow-up appointment in: January 2020      Any Other Special Instructions Will Be Listed Below (If Applicable).     If you need a refill on your cardiac medications before your next appointment, please call your pharmacy.

## 2018-05-28 NOTE — Progress Notes (Signed)
Cardiology Office Note  Date: 05/28/2018   ID: Rodney Baldwin, DOB [birth year], MRN [id number]  PCP: Rodney Sites, MD  Consulting Cardiologist: Rodney Lesches, MD   Chief Complaint  Patient presents with  . Coronary Artery Disease    History of Present Illness: Rodney Baldwin is a 47 y.o. male referred for cardiology consultation by Rodney Baldwin with recently documented history of CAD and NSTEMI.  I reviewed the available records and updated the chart.  Patient was hospitalized in [place] in September with NSTEMI, peak troponin I level up to 39.  He underwent cardiac catheterization at that time demonstrating overall mild to moderate nonobstructive CAD with the exception of an occluded ramus intermedius that was the culprit lesion.  This was managed with DES intervention, LVEF reported to be 60%.  He presents today for evaluation.  Reports no angina symptoms or shortness of breath, feels that his stamina is normal.  He states that he has been compliant with his medications, although by chance did not remember to take them last night.  Blood pressure is higher than his usual by report.  He does not report any bleeding problems on aspirin and Brilinta.  I talked with him today about an anticipated 1 year course, uninterrupted.  He has not had follow-up lipids as yet, LDL was 123 in September.  I personally reviewed his ECG today which shows normal sinus rhythm, Q wave in lead III.  He is a Administrator by trade.  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  He will need a stress test at that time in light of his infarct in September.   Past Medical History:  Diagnosis Date  . CAD (coronary artery disease)    Occluded ramus intermedius status post DES September 2019  . GERD (gastroesophageal reflux disease)   . Hypertension   . Laryngitis   . NSTEMI (non-ST elevated myocardial infarction) [name])    September 2019    . Strep throat   . Tonsillitis     Past Surgical History:  Procedure Laterality Date  . WISDOM TOOTH EXTRACTION      Current Outpatient Medications  Medication Sig Dispense Refill  . aspirin 81 MG chewable tablet Chew by mouth.    Marland Kitchen atorvastatin (LIPITOR) 80 MG tablet Take 80 mg by mouth daily.     Marland Kitchen esomeprazole (NEXIUM) 20 MG capsule Take 20 mg by mouth daily at 12 noon.    Marland Kitchen losartan (COZAAR) 25 MG tablet Take 25 mg by mouth daily.     . metoprolol tartrate (LOPRESSOR) 25 MG tablet Take 12.5 mg by mouth 2 (two) times daily.     . ticagrelor (BRILINTA) 90 MG TABS tablet Take 90 mg by mouth daily.      No current facility-administered medications for this visit.    Allergies:  Patient has no known allergies.   Social History: The patient  reports that he quit smoking about 2 months ago. His smoking use included cigarettes. He smoked 0.50 packs per day. He has never used smokeless tobacco. He reports that he does not drink alcohol or use drugs.   Family History: The patient's family history includes CAD in his father and mother; Diabetes in his mother.   ROS:  Please see the history of present illness. Otherwise, complete review of systems is positive for none.  All other systems are reviewed and negative.   Physical Exam:  VS:  BP (!) 152/80 (BP Location: Left Arm)   Pulse 74   Ht 6' (1.829 m)   Wt 212 lb (96.2 kg)   SpO2 98%   BMI 28.75 kg/m , BMI Body mass index is 28.75 kg/m.  Wt Readings from Last 3 Encounters:  05/28/18 212 lb (96.2 kg)  01/14/16 198 lb (89.8 kg)  07/07/15 207 lb (93.9 kg)    General: Patient appears comfortable at rest. HEENT: Conjunctiva and lids normal, oropharynx clear. Neck: Supple, no elevated JVP or carotid bruits, no thyromegaly. Lungs: Clear to auscultation, nonlabored breathing at rest. Cardiac: Regular rate and rhythm, no S3 or significant systolic murmur, no pericardial rub. Abdomen: Soft, nontender, bowel sounds  present. Extremities: No pitting edema, distal pulses 2+. Skin: Warm and dry. Musculoskeletal: No kyphosis. Neuropsychiatric: Alert and oriented x3, affect grossly appropriate.  ECG: No previous tracing available for review today.  Recent Labwork:  September 2019: Potassium 3.8, BUN 13, creatinine 0.9, cholesterol 188, HDL 25, triglycerides 202, LDL 123, peak troponin I 39.3, AST 9, ALT 29, hemoglobin 13.4, platelets 188, TSH 1.22  Other Studies Reviewed Today:  Cardiac catheterization and PCI 04/04/2018 ([place]): Hemodynamics LVEDP 25 LV 125//25 Ao 125/78  LVEF 60%  Cors: Right dominant LM ok LAD 40% irreg, 60% small Diag - plan MedRx RI moderate 100% prox-culprit LCx nondom 20-30% irregs, NSD RCA large dom 20-40% irregs, NSD  PCI of RI 100% occlusion: 6 Fr EBU 3.5, Runthrough, heparin, Integrilin  Pre dilated 2x8 Stent ONYX 2.5x18 DES Post 2.5x12 [place] to rated  0% residual TIMI III flow  Assessment and Plan:  1.  CAD status post NSTEMI in September with DES to culprit ramus intermedius lesion at facility in [place] as outlined above.  Otherwise he had mild nonobstructive atherosclerosis and LVEF was reported at 60%.  He is doing well without active angina.  I reinforced compliance with medications including continuing dual antiplatelet regimen for a year.  We will plan on an exercise Myoview first week of January (hold beta-blocker) for follow-up evaluation as part of DOT clearance.  2.  Hyperlipidemia, reports compliance with Lipitor.  LDL was 123 in September.  Follow-up FLP and LFTs.  3.  Tobacco abuse, long-standing.  He quit smoking in September.  He has been vaping, we discussed stopping this as well.  4.  Essential hypertension, blood pressures elevated today but he states that he forgot to take his medicines yesterday.  Continue present doses of Cozaar and Lopressor.  These can be uptitrated if needed.  Current medicines were reviewed with the  patient today.   Orders Placed This Encounter  Procedures  . NM Myocar Multi W/Spect W/Wall Motion / EF  . Lipid Profile  . Hepatic function panel  . EKG 12-Lead    Disposition: Follow-up in January.  Signed, Satira Sark, MD, [facility] 05/28/2018 1:51 PM    [place] at [facility] [address] [place], [place] [address] Phone: [phone]; Fax: [phone]

## 2018-05-29 ENCOUNTER — Other Ambulatory Visit: Payer: Self-pay | Admitting: Cardiology

## 2018-05-30 LAB — LIPID PANEL W/O CHOL/HDL RATIO
Cholesterol, Total: 138 mg/dL (ref 100–199)
HDL: 27 mg/dL — AB (ref >39)
LDL Calculated: 62 mg/dL (ref 0–99)
Triglycerides: 247 mg/dL — ABNORMAL HIGH (ref 0–149)
VLDL Cholesterol Cal: 49 mg/dL — ABNORMAL HIGH (ref 5–40)

## 2018-05-30 LAB — HEPATIC FUNCTION PANEL
ALBUMIN: 4.3 g/dL (ref 3.5–5.5)
ALK PHOS: 112 IU/L (ref 39–117)
ALT: 30 IU/L (ref 0–44)
AST: 13 IU/L (ref 0–40)
BILIRUBIN TOTAL: 0.4 mg/dL (ref 0.0–1.2)
BILIRUBIN, DIRECT: 0.1 mg/dL (ref 0.00–0.40)
Total Protein: 7 g/dL (ref 6.0–8.5)

## 2018-05-30 LAB — SPECIMEN STATUS REPORT

## 2018-07-14 ENCOUNTER — Encounter (HOSPITAL_COMMUNITY)
Admission: RE | Admit: 2018-07-14 | Discharge: 2018-07-14 | Disposition: A | Discharge status: Home / Self Care | Payer: 59 | Source: Ambulatory Visit | Attending: Cardiology | Admitting: Cardiology

## 2018-07-14 ENCOUNTER — Encounter (HOSPITAL_BASED_OUTPATIENT_CLINIC_OR_DEPARTMENT_OTHER)
Admission: RE | Admit: 2018-07-14 | Discharge: 2018-07-14 | Disposition: A | Discharge status: Home / Self Care | Payer: 59 | Source: Ambulatory Visit | Attending: Cardiology | Admitting: Cardiology

## 2018-07-14 ENCOUNTER — Encounter (HOSPITAL_COMMUNITY): Payer: Self-pay

## 2018-07-14 DIAGNOSIS — R0683 Snoring: Secondary | ICD-10-CM | POA: Diagnosis not present

## 2018-07-14 DIAGNOSIS — I25119 Atherosclerotic heart disease of native coronary artery with unspecified angina pectoris: Secondary | ICD-10-CM

## 2018-07-14 LAB — NM MYOCAR MULTI W/SPECT W/WALL MOTION / EF
CHL CUP MPHR: 173 {beats}/min
CHL CUP NUCLEAR SRS: 0
CHL CUP NUCLEAR SSS: 0
CHL CUP RESTING HR STRESS: 57 {beats}/min
CHL RATE OF PERCEIVED EXERTION: 15
CSEPEDS: 18 s
CSEPHR: 77 %
CSEPPHR: 134 {beats}/min
Estimated workload: 10.1 METS
Exercise duration (min): 8 min
LV dias vol: 140 mL (ref 62–150)
LVSYSVOL: 54 mL
RATE: 0.6
SDS: 0
TID: 0.9

## 2018-07-14 MED ORDER — REGADENOSON 0.4 MG/5ML IV SOLN
INTRAVENOUS | Status: AC
  Administered 2018-07-14: 0.4 mg via INTRAVENOUS
  Filled 2018-07-14: qty 5

## 2018-07-14 MED ORDER — SODIUM CHLORIDE 0.9% FLUSH
INTRAVENOUS | Status: AC
  Administered 2018-07-14: 10 mL via INTRAVENOUS
  Filled 2018-07-14: qty 10

## 2018-07-14 MED ORDER — TECHNETIUM TC 99M TETROFOSMIN IV KIT
10.0000 | PACK | Freq: Once | INTRAVENOUS | Status: AC | PRN
  Administered 2018-07-14: 10.2 via INTRAVENOUS

## 2018-07-14 MED ORDER — TECHNETIUM TC 99M TETROFOSMIN IV KIT
30.0000 | PACK | Freq: Once | INTRAVENOUS | Status: AC | PRN
  Administered 2018-07-14: 30 via INTRAVENOUS

## 2018-07-15 DIAGNOSIS — E663 Overweight: Secondary | ICD-10-CM | POA: Diagnosis not present

## 2018-07-15 DIAGNOSIS — G4730 Sleep apnea, unspecified: Secondary | ICD-10-CM | POA: Diagnosis not present

## 2018-07-15 DIAGNOSIS — Z024 Encounter for examination for driving license: Secondary | ICD-10-CM | POA: Diagnosis not present

## 2018-07-15 DIAGNOSIS — Z6829 Body mass index (BMI) 29.0-29.9, adult: Secondary | ICD-10-CM | POA: Diagnosis not present

## 2018-07-30 ENCOUNTER — Ambulatory Visit: Payer: Managed Care, Other (non HMO) | Admitting: Cardiology

## 2018-07-30 DIAGNOSIS — R0989 Other specified symptoms and signs involving the circulatory and respiratory systems: Secondary | ICD-10-CM

## 2018-07-30 NOTE — Progress Notes (Deleted)
Cardiology Office Note  Date: 07/30/2018   ID: [name], DOB 1971/03/03, MRN [id number]  PCP: Ginger Organ  Primary Cardiologist: Rozann Lesches, MD   No chief complaint on file.   History of Present Illness: Rodney Baldwin is a 48 y.o. male last seen in November 2019.  He had a follow-up Lexiscan Myoview in January that was overall low risk, no significant perfusion defects and LVEF 55 to 65%.  Past Medical History:  Diagnosis Date  . CAD (coronary artery disease)    Occluded ramus intermedius status post DES September 2019  . GERD (gastroesophageal reflux disease)   . Hypertension   . Laryngitis   . NSTEMI (non-ST elevated myocardial infarction) [facility])    September 2019  . Strep throat   . Tonsillitis     Past Surgical History:  Procedure Laterality Date  . WISDOM TOOTH EXTRACTION      Current Outpatient Medications  Medication Sig Dispense Refill  . aspirin 81 MG chewable tablet Chew by mouth.    Marland Kitchen atorvastatin (LIPITOR) 80 MG tablet Take 80 mg by mouth daily.     Marland Kitchen esomeprazole (NEXIUM) 20 MG capsule Take 20 mg by mouth daily at 12 noon.    Marland Kitchen losartan (COZAAR) 25 MG tablet Take 25 mg by mouth daily.     . metoprolol tartrate (LOPRESSOR) 25 MG tablet Take 12.5 mg by mouth 2 (two) times daily.     . ticagrelor (BRILINTA) 90 MG TABS tablet Take 90 mg by mouth daily.      No current facility-administered medications for this visit.    Allergies:  Patient has no known allergies.   Social History: The patient  reports that he quit smoking about 4 months ago. His smoking use included cigarettes. He smoked 0.50 packs per day. He has never used smokeless tobacco. He reports that he does not drink alcohol or use drugs.   Family History: The patient's family history includes CAD in his father and mother; Diabetes in his mother.   ROS:  Please see the history of present illness. Otherwise, complete review of systems is positive for {NONE  DEFAULTED:18576::"none"}.  All other systems are reviewed and negative.   Physical Exam: VS:  There were no vitals taken for this visit., BMI There is no height or weight on file to calculate BMI.  Wt Readings from Last 3 Encounters:  05/28/18 212 lb (96.2 kg)  01/14/16 198 lb (89.8 kg)  07/07/15 207 lb (93.9 kg)    General: Patient appears comfortable at rest. HEENT: Conjunctiva and lids normal, oropharynx clear with moist mucosa. Neck: Supple, no elevated JVP or carotid bruits, no thyromegaly. Lungs: Clear to auscultation, nonlabored breathing at rest. Cardiac: Regular rate and rhythm, no S3 or significant systolic murmur, no pericardial rub. Abdomen: Soft, nontender, no hepatomegaly, bowel sounds present, no guarding or rebound. Extremities: No pitting edema, distal pulses 2+. Skin: Warm and dry. Musculoskeletal: No kyphosis. Neuropsychiatric: Alert and oriented x3, affect grossly appropriate.  ECG: I personally reviewed the tracing from 05/28/2018 which showed sinus rhythm with Q wave in lead III.  Recent Labwork: 05/29/2018: ALT 30; AST 13     Component Value Date/Time   CHOL 138 05/29/2018 0937   TRIG 247 (H) 05/29/2018 0937   HDL 27 (L) 05/29/2018 0937   LDLCALC 62 05/29/2018 0937    Other Studies Reviewed Today:  Cardiac catheterization and PCI 04/04/2018 ([place]): Hemodynamics LVEDP 25 LV 125//25 Ao 125/78  LVEF 60%  Cors: Right dominant LM ok LAD 40% irreg, 60% small Diag - plan MedRx RI moderate 100% prox-culprit LCx nondom 20-30% irregs, NSD RCA large dom 20-40% irregs, NSD  PCI of RI 100% occlusion: 6 Fr EBU 3.5, Runthrough, heparin, Integrilin  Pre dilated 2x8 Stent ONYX 2.5x18 DES Post 2.5x12 [place] to rated  0% residual TIMI III flow  Lexiscan Myoview 07/14/2018:  Converted to pharmacological stress test, did not reach target heart rate with exercise  The study is normal. There are no perfusion defects  This is a low risk  study.  The left ventricular ejection fraction is normal (55-65%).  Assessment and Plan:   Current medicines were reviewed with the patient today.  No orders of the defined types were placed in this encounter.   Disposition:  Signed, Satira Sark, MD, [facility] 07/30/2018 10:33 AM    [place] at [place] [place], [place] [address] Phone: [phone]; Fax: [phone]

## 2018-07-31 ENCOUNTER — Encounter: Payer: Self-pay | Admitting: Cardiology

## 2018-08-04 DIAGNOSIS — G4733 Obstructive sleep apnea (adult) (pediatric): Secondary | ICD-10-CM | POA: Diagnosis not present

## 2018-08-08 DIAGNOSIS — G4733 Obstructive sleep apnea (adult) (pediatric): Secondary | ICD-10-CM | POA: Diagnosis not present

## 2018-09-06 DIAGNOSIS — G4733 Obstructive sleep apnea (adult) (pediatric): Secondary | ICD-10-CM | POA: Diagnosis not present

## 2018-10-05 DIAGNOSIS — G4733 Obstructive sleep apnea (adult) (pediatric): Secondary | ICD-10-CM | POA: Diagnosis not present

## 2018-11-05 DIAGNOSIS — G4733 Obstructive sleep apnea (adult) (pediatric): Secondary | ICD-10-CM | POA: Diagnosis not present

## 2020-03-25 ENCOUNTER — Ambulatory Visit: Payer: 59 | Admitting: Family Medicine

## 2020-05-03 NOTE — Progress Notes (Signed)
Cardiology Office Note  Date: 05/04/2020   ID: Rodney Baldwin, DOB [birth year], MRN [id number]  PCP:  Cory Munch, PA-C  Cardiologist:  Rozann Lesches, MD Electrophysiologist:  None   Chief Complaint  Patient presents with  . Cardiac follow-up    History of Present Illness: Rodney Baldwin is a 49 y.o. male seen in consultation back in November 2019.  He presents overdue for follow-up.  States that he has been doing well, no angina symptoms or unusual shortness of breath with typical ADLs.  Continues to work as a Administrator, Primary school teacher.  Through diet he has lost about 38 pounds, feels better.  We went over his medications which are listed below.  We discussed stopping Brilinta at this point now 2 years out from DES intervention.  He reports no intolerances to Lipitor and his recent LDL was 66.  He has noticed that his blood pressure has come down with weight loss, feels somewhat fatigued in the afternoons.  We will cut his losartan back to 12-1/2 mg daily.  Also provide prescription for as needed nitroglycerin.  He will be having a follow-up DOT physical in December, we will plan to go ahead and arrange a Lexiscan Myoview.  His last assessment was low risk in January 2020.   Past Medical History:  Diagnosis Date  . CAD (coronary artery disease)    Occluded ramus intermedius status post DES September 2019  . GERD (gastroesophageal reflux disease)   . Hypertension   . Laryngitis   . NSTEMI (non-ST elevated myocardial infarction) [facility])    September 2019  . Strep throat   . Tonsillitis     Past Surgical History:  Procedure Laterality Date  . WISDOM TOOTH EXTRACTION      Current Outpatient Medications  Medication Sig Dispense Refill  . aspirin 81 MG chewable tablet Chew by mouth.    Marland Kitchen atorvastatin (LIPITOR) 80 MG tablet Take 80 mg by mouth daily.     Marland Kitchen esomeprazole (NEXIUM) 20 MG capsule Take 20 mg by mouth daily at 12 noon.    Marland Kitchen losartan (COZAAR) 25 MG tablet  Take 0.5 tablets (12.5 mg total) by mouth daily. 45 tablet 3  . metoprolol tartrate (LOPRESSOR) 25 MG tablet Take 12.5 mg by mouth 2 (two) times daily.     . nitroGLYCERIN (NITROSTAT) 0.4 MG SL tablet Place 1 tablet (0.4 mg total) under the tongue every 5 (five) minutes x 3 doses as needed for chest pain (if no relief after 2nd dose, proceed to the ED for an evaluation or call 911). 75 tablet 1   No current facility-administered medications for this visit.   Allergies:  Patient has no known allergies.   ROS: No palpitations or syncope.  Physical Exam: VS:  BP 124/78   Pulse (!) 55   Ht 6' (1.829 m)   Wt 184 lb (83.5 kg)   SpO2 97%   BMI 24.95 kg/m , BMI Body mass index is 24.95 kg/m.  Wt Readings from Last 3 Encounters:  05/04/20 184 lb (83.5 kg)  05/28/18 212 lb (96.2 kg)  01/14/16 198 lb (89.8 kg)    General: Patient appears comfortable at rest. HEENT: Conjunctiva and lids normal, wearing a mask. Neck: Supple, no elevated JVP or carotid bruits, no thyromegaly. Lungs: Clear to auscultation, nonlabored breathing at rest. Cardiac: Regular rate and rhythm, no S3 or significant systolic murmur, no pericardial rub. Extremities: No pitting edema, distal pulses 2+.   ECG:  An ECG dated 05/28/2018 was personally reviewed today and demonstrated:  Normal sinus rhythm, Q-wave in lead III.  Recent Labwork:    Component Value Date/Time   CHOL 138 05/29/2018 0937   TRIG 247 (H) 05/29/2018 0937   HDL 27 (L) 05/29/2018 0937   LDLCALC 62 05/29/2018 0937  August 2021: Cholesterol 111, HDL 31, TG 62, LDL 66, Hgb A1c 5.6%, creatinine 0.93  Other Studies Reviewed Today:  Cardiac catheterization and PCI 04/04/2018 ([place]): Hemodynamics LVEDP 25 LV 125//25 Ao 125/78  LVEF 60%  Cors: Right dominant LM ok LAD 40% irreg, 60% small Diag - plan MedRx RI moderate 100% prox-culprit LCx nondom 20-30% irregs, NSD RCA large dom 20-40% irregs, NSD  PCI of RI 100%  occlusion: 6 Fr EBU 3.5, Runthrough, heparin, Integrilin  Pre dilated 2x8 Stent ONYX 2.5x18 DES Post 2.5x12 Naschitti to rated  0% residual TIMI III flow  Lexiscan Myoview 07/14/2018:  Converted to pharmacological stress test, did not reach target heart rate with exercise  The study is normal. There are no perfusion defects  This is a low risk study.  The left ventricular ejection fraction is normal (55-65%).  Assessment and Plan:  1.  CAD status post DES to the ramus intermedius in September 2019.  He reports no active angina on medical therapy.  ECG reviewed today showing sinus bradycardia.  Plan to stop Brilinta at this time.  Continue aspirin, Lipitor, losartan, and Lopressor.  Prescription provided for as needed nitroglycerin.  He is pending DOT physical in December, we will arrange a Lexiscan Myoview.  I talked with him about a walking plan for exercise.  2.  Mixed hyperlipidemia, tolerating high-dose Lipitor.  Recent LDL 66.  3.  Essential hypertension, reduce losartan to 12.5 mg daily and continue current dose of Lopressor.  Recent lab work in August showed creatinine 0.93.  Blood pressure has come down with weight loss.  Medication Adjustments/Labs and Tests Ordered: Current medicines are reviewed at length with the patient today.  Concerns regarding medicines are outlined above.   Tests Ordered: Orders Placed This Encounter  Procedures  . NM Myocar Multi W/Spect W/Wall Motion / EF  . EKG 12-Lead    Medication Changes: Meds ordered this encounter  Medications  . losartan (COZAAR) 25 MG tablet    Sig: Take 0.5 tablets (12.5 mg total) by mouth daily.    Dispense:  45 tablet    Refill:  3    05/04/2020 dose decrease  . nitroGLYCERIN (NITROSTAT) 0.4 MG SL tablet    Sig: Place 1 tablet (0.4 mg total) under the tongue every 5 (five) minutes x 3 doses as needed for chest pain (if no relief after 2nd dose, proceed to the ED for an evaluation or call 911).    Dispense:  75  tablet    Refill:  1    Disposition:  Follow up 1 year in the [place] office.  Signed, Satira Sark, MD, [facility] 05/04/2020 9:06 AM    [place] [place] [facility] [place], [place], [place] [address] Phone: [phone]; Fax: [phone]

## 2020-05-04 ENCOUNTER — Encounter: Payer: Self-pay | Admitting: *Deleted

## 2020-05-04 ENCOUNTER — Ambulatory Visit: Payer: 59 | Admitting: Cardiology

## 2020-05-04 ENCOUNTER — Encounter: Payer: Self-pay | Admitting: Cardiology

## 2020-05-04 VITALS — BP 124/78 | HR 55 | Ht 72.0 in | Wt 184.0 lb

## 2020-05-04 DIAGNOSIS — E782 Mixed hyperlipidemia: Secondary | ICD-10-CM

## 2020-05-04 DIAGNOSIS — I25119 Atherosclerotic heart disease of native coronary artery with unspecified angina pectoris: Secondary | ICD-10-CM | POA: Diagnosis not present

## 2020-05-04 DIAGNOSIS — I10 Essential (primary) hypertension: Secondary | ICD-10-CM

## 2020-05-04 MED ORDER — NITROGLYCERIN 0.4 MG SL SUBL: 0.4000 mg | SUBLINGUAL_TABLET | SUBLINGUAL | 1 refills | Status: DC | PRN

## 2020-05-04 MED ORDER — LOSARTAN POTASSIUM 25 MG PO TABS: 12.5000 mg | ORAL_TABLET | Freq: Every day | ORAL | 3 refills | Status: DC

## 2020-05-04 NOTE — Patient Instructions (Addendum)
Medication Instructions:   Your physician has recommended you make the following change in your medication:   Decrease losartan to 12.5 mg daily. Break your 25 mg tablet in half daily  Stop brilinta.  You have been given a prescription for nitroglycerin tablets. Dissolve one under tongue for chest pain every 5 minutes up to 3 doses. If no relief, after 2 nd dose, proceed to ED or call 911  Continue other medications the same  Labwork:  None  Testing/Procedures: Your physician has requested that you have a lexiscan myoview. For further information please visit [email or web address]. Please follow instruction sheet, as given.  Follow-Up:  Your physician recommends that you schedule a follow-up appointment in: 1 year ([place]). You will receive a reminder letter in the mail in about 10 months reminding you to call and schedule your appointment. If you don't receive this letter, please contact our office.  Any Other Special Instructions Will Be Listed Below (If Applicable).  If you need a refill on your cardiac medications before your next appointment, please call your pharmacy.

## 2020-05-20 ENCOUNTER — Emergency Department (HOSPITAL_COMMUNITY)
Admission: EM | Admit: 2020-05-20 | Discharge: 2020-05-20 | Disposition: A | Discharge status: Home / Self Care | Payer: 59 | Attending: Emergency Medicine | Admitting: Emergency Medicine

## 2020-05-20 ENCOUNTER — Other Ambulatory Visit: Payer: Self-pay

## 2020-05-20 ENCOUNTER — Emergency Department (HOSPITAL_COMMUNITY): Payer: 59

## 2020-05-20 ENCOUNTER — Encounter (HOSPITAL_COMMUNITY): Payer: Self-pay

## 2020-05-20 DIAGNOSIS — M25522 Pain in left elbow: Secondary | ICD-10-CM | POA: Insufficient documentation

## 2020-05-20 DIAGNOSIS — M79632 Pain in left forearm: Secondary | ICD-10-CM | POA: Diagnosis not present

## 2020-05-20 DIAGNOSIS — I10 Essential (primary) hypertension: Secondary | ICD-10-CM | POA: Diagnosis not present

## 2020-05-20 DIAGNOSIS — Z87891 Personal history of nicotine dependence: Secondary | ICD-10-CM | POA: Insufficient documentation

## 2020-05-20 DIAGNOSIS — I2510 Atherosclerotic heart disease of native coronary artery without angina pectoris: Secondary | ICD-10-CM | POA: Diagnosis not present

## 2020-05-20 MED ORDER — IBUPROFEN 800 MG PO TABS
800.0000 mg | ORAL_TABLET | Freq: Once | ORAL | Status: AC
  Administered 2020-05-20: 800 mg via ORAL
  Filled 2020-05-20: qty 1

## 2020-05-20 MED ORDER — OXYCODONE-ACETAMINOPHEN 5-325 MG PO TABS
1.0000 | ORAL_TABLET | Freq: Four times a day (QID) | ORAL | 0 refills | Status: DC | PRN
Start: 2020-05-20 — End: 2020-11-18

## 2020-05-20 MED ORDER — IBUPROFEN 800 MG PO TABS: 800.0000 mg | ORAL_TABLET | Freq: Three times a day (TID) | ORAL | 0 refills | Status: DC | PRN

## 2020-05-20 MED ORDER — SENNOSIDES-DOCUSATE SODIUM 8.6-50 MG PO TABS: 1.0000 | ORAL_TABLET | Freq: Every evening | ORAL | 0 refills | Status: DC | PRN

## 2020-05-20 NOTE — ED Notes (Signed)
Pt in vertical triage, sling placed, pt states that the support is helping with his pain, pain med given, pt states that he is ready to go home.

## 2020-05-20 NOTE — ED Provider Notes (Signed)
Emergency Department Provider Note   I have reviewed the triage vital signs and the nursing notes.   HISTORY  Chief Complaint Arm Pain   HPI Rodney Baldwin is a 49 y.o. male with past medical history of CAD presents to the emergency department with pain in the left elbow and forearm after pulling himself up into his truck today.  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  There is occasional radiation of symptoms to the shoulder but no neck pain.  No numbness in the hand.  He is feeling some pain with any movement of the elbow or rotation of the forearm.  No additional areas of injury.  He does appreciate some swelling to the left forearm as well.   Past Medical History:  Diagnosis Date  . CAD (coronary artery disease)    Occluded ramus intermedius status post DES September 2019  . GERD (gastroesophageal reflux disease)   . Hypertension   . Laryngitis   . NSTEMI (non-ST elevated myocardial infarction) [facility])    September 2019  . Strep throat   . Tonsillitis     There are no problems to display for this patient.   Past Surgical History:  Procedure Laterality Date  . WISDOM TOOTH EXTRACTION      Allergies Patient has no known allergies.  Family History  Problem Relation Age of Onset  . CAD Mother   . Diabetes Mother   . CAD Father     Social History Social History   Tobacco Use  . Smoking status: Former Smoker    Packs/day: 0.50    Types: Cigarettes    Quit date: 03/28/2018    Years since quitting: 2.1  . Smokeless tobacco: Never Used  Vaping Use  . Vaping Use: Every day  . Devices: 2 mg cartridge  Substance Use Topics  . Alcohol use: No  . Drug use: No    Review of Systems  Constitutional: No fever/chills Cardiovascular: Denies chest pain. Respiratory: Denies shortness of breath. Musculoskeletal: Negative  for neck pain. Pain in the left elbow and forearm.  Skin: Negative for rash. Neurological: Negative for headaches, focal weakness or numbness.   ____________________________________________   PHYSICAL EXAM:  VITAL SIGNS: ED Triage Vitals  Enc Vitals Group     BP 05/20/20 1833 (!) 141/83     Pulse Rate 05/20/20 1833 70     Resp 05/20/20 1833 18     Temp 05/20/20 1833 98.4 F (36.9 C)     Temp Source 05/20/20 1833 Oral     SpO2 05/20/20 1833 97 %     Weight 05/20/20 1835 182 lb 15.7 oz (83 kg)     Height 05/20/20 1835 6' (1.829 m)   Constitutional: Alert and oriented. Well appearing and in no acute distress. Eyes: Conjunctivae are normal. Head: Atraumatic. Nose: No congestion/rhinnorhea. Mouth/Throat: Mucous membranes are moist.   Neck: No stridor.   Cardiovascular: Good peripheral circulation. 2+ radial pulse on the left.  Respiratory: Normal respiratory effort.   Gastrointestinal: No distention.  Musculoskeletal: Mild swelling of the left arm in comparison to the right. Tenderness over the medial and lateral elbow. No joint swelling or warmth. No tenderness to palpation of the wrist. No overlying skin changes.  Neurologic:  Normal speech and language. Normal sensation over the left hand. Decreased grip strength  on the left compared to the right.  Skin:  Skin is warm, dry and intact. No rash noted.  ____________________________________________  RADIOLOGY  DG Elbow Complete Left  Result Date: 05/20/2020 CLINICAL DATA:  Arm pain and injury EXAM: LEFT ELBOW - COMPLETE 3+ VIEW COMPARISON:  None. FINDINGS: There is no evidence of fracture, dislocation, or joint effusion. There is no evidence of arthropathy or other focal bone abnormality. Soft tissues are unremarkable. IMPRESSION: Negative. Electronically Signed   By: Donavan Foil M.D.   On: 05/20/2020 21:02   DG Forearm Left  Result Date: 05/20/2020 CLINICAL DATA:  Arm pain and injury EXAM: LEFT FOREARM - 2 VIEW  COMPARISON:  None. FINDINGS: There is no evidence of fracture or other focal bone lesions. Soft tissues are unremarkable. IMPRESSION: Negative. Electronically Signed   By: Donavan Foil M.D.   On: 05/20/2020 21:03    ____________________________________________   PROCEDURES  Procedure(s) performed:   Procedures  None  ____________________________________________   INITIAL IMPRESSION / ASSESSMENT AND PLAN / ED COURSE  Pertinent labs & imaging results that were available during my care of the patient were reviewed by me and considered in my medical decision making (see chart for details).   Patient presents to the emergency department with musculoskeletal pain in the left elbow and forearm after pulling himself up into the truck.  He felt a pull in this area and is now tender to palpation and has pain with movement.  He does have some decreased grip strength in the left hand but this appears to be pain related.  He has normal range of motion of the wrist and shoulder.  He is not having neck pain or other radicular symptoms.  My suspicion for upper extremity DVT is very low in the setting.  Plan for plain films along with anti-inflammatory medications. Exam not consistent with septic joint.   Imaging reviewed with no bony injury. Plan for sling with caution regarding frozen joint if not removing for ROM exercises. Plan for ortho follow up. Small amount of pain medication provided after review of the [place] drug database.  ____________________________________________  FINAL CLINICAL IMPRESSION(S) / ED DIAGNOSES  Final diagnoses:  Left elbow pain     MEDICATIONS GIVEN DURING THIS VISIT:  Medications  ibuprofen (ADVIL) tablet 800 mg (800 mg Oral Given 05/20/20 2226)     NEW OUTPATIENT MEDICATIONS STARTED DURING THIS VISIT:  Discharge Medication List as of 05/20/2020  9:14 PM    START taking these medications   Details  ibuprofen (ADVIL) 800 MG tablet Take 1 tablet (800 mg total) by  mouth every 8 (eight) hours as needed for moderate pain., Starting Fri 05/20/2020, Normal    oxyCODONE-acetaminophen (PERCOCET/ROXICET) 5-325 MG tablet Take 1 tablet by mouth every 6 (six) hours as needed for severe pain., Starting Fri 05/20/2020, Normal    senna-docusate (SENOKOT-S) 8.6-50 MG tablet Take 1 tablet by mouth at bedtime as needed for mild constipation or moderate constipation., Starting Fri 05/20/2020, Normal        Note:  This document was prepared using Dragon voice recognition software and may include unintentional dictation errors.  Nanda Quinton, MD, [facility] Emergency Medicine    Jodeci Rini, Wonda Olds, MD 05/21/20 [phone]

## 2020-05-20 NOTE — Discharge Instructions (Signed)
You were seen in the ED today with elbow and forearm pain. Your x-ray did not show an broken bone but you likely pulled a muscle for strained a tendon. I have provided a sling for comfort but be sure to remove your arm at least once every hour and move it around fully for several minutes to avoid joint stiffness or "frozen" joint. Please use Percocet only as needed for severe pain. 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 Call the orthopedic doctor listed on Monday to schedule a follow up appointment ASAP. Return to the ED with any chest pain, numbness, or other sudden severe symptoms.

## 2020-05-20 NOTE — ED Triage Notes (Signed)
Pt reports that he was pulling up into truck and felt a pull and some nerve pain. Pain in left arm. Difficulty in rotating arm.

## 2020-05-30 ENCOUNTER — Encounter (HOSPITAL_BASED_OUTPATIENT_CLINIC_OR_DEPARTMENT_OTHER)
Admission: RE | Admit: 2020-05-30 | Discharge: 2020-05-30 | Disposition: A | Discharge status: Home / Self Care | Payer: 59 | Source: Ambulatory Visit | Attending: Cardiology | Admitting: Cardiology

## 2020-05-30 ENCOUNTER — Other Ambulatory Visit: Payer: Self-pay

## 2020-05-30 ENCOUNTER — Encounter (HOSPITAL_COMMUNITY): Payer: Self-pay

## 2020-05-30 ENCOUNTER — Encounter (HOSPITAL_COMMUNITY)
Admission: RE | Admit: 2020-05-30 | Discharge: 2020-05-30 | Disposition: A | Discharge status: Home / Self Care | Payer: 59 | Source: Ambulatory Visit | Attending: Cardiology | Admitting: Cardiology

## 2020-05-30 DIAGNOSIS — I25119 Atherosclerotic heart disease of native coronary artery with unspecified angina pectoris: Secondary | ICD-10-CM | POA: Diagnosis not present

## 2020-05-30 LAB — NM MYOCAR MULTI W/SPECT W/WALL MOTION / EF
LV dias vol: 170 mL (ref 62–150)
LV sys vol: 76 mL
Peak HR: 79 {beats}/min
RATE: 0.66
Rest HR: 44 {beats}/min
SDS: 2
SRS: 0
SSS: 2
TID: 1.11

## 2020-05-30 MED ORDER — REGADENOSON 0.4 MG/5ML IV SOLN
INTRAVENOUS | Status: AC
  Administered 2020-05-30: 0.4 mg via INTRAVENOUS
  Filled 2020-05-30: qty 5

## 2020-05-30 MED ORDER — SODIUM CHLORIDE FLUSH 0.9 % IV SOLN
INTRAVENOUS | Status: AC
  Administered 2020-05-30: 10 mL via INTRAVENOUS
  Filled 2020-05-30: qty 10

## 2020-05-30 MED ORDER — TECHNETIUM TC 99M TETROFOSMIN IV KIT
30.0000 | PACK | Freq: Once | INTRAVENOUS | Status: AC | PRN
  Administered 2020-05-30: 33 via INTRAVENOUS

## 2020-05-30 MED ORDER — TECHNETIUM TC 99M TETROFOSMIN IV KIT
10.0000 | PACK | Freq: Once | INTRAVENOUS | Status: AC | PRN
  Administered 2020-05-30: 11 via INTRAVENOUS

## 2020-06-01 ENCOUNTER — Telehealth: Payer: Self-pay | Admitting: *Deleted

## 2020-06-01 NOTE — Telephone Encounter (Signed)
Patient informed. Copy sent to PCP °

## 2020-06-01 NOTE — Telephone Encounter (Signed)
-----   Message from Satira Sark, MD sent at 05/30/2020  4:04 PM EST ----- Results reviewed.  Follow-up Myoview is low risk, no significant ischemia to suggest progressive obstructive CAD.  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

## 2020-07-05 ENCOUNTER — Other Ambulatory Visit: Payer: Self-pay | Admitting: Cardiology

## 2020-08-06 ENCOUNTER — Emergency Department (HOSPITAL_COMMUNITY)
Admission: EM | Admit: 2020-08-06 | Discharge: 2020-08-06 | Disposition: A | Discharge status: Home / Self Care | Payer: 59 | Attending: Emergency Medicine | Admitting: Emergency Medicine

## 2020-08-06 ENCOUNTER — Emergency Department (HOSPITAL_COMMUNITY): Payer: 59

## 2020-08-06 ENCOUNTER — Other Ambulatory Visit: Payer: Self-pay

## 2020-08-06 ENCOUNTER — Encounter (HOSPITAL_COMMUNITY): Payer: Self-pay | Admitting: Emergency Medicine

## 2020-08-06 DIAGNOSIS — I2510 Atherosclerotic heart disease of native coronary artery without angina pectoris: Secondary | ICD-10-CM | POA: Insufficient documentation

## 2020-08-06 DIAGNOSIS — Z7982 Long term (current) use of aspirin: Secondary | ICD-10-CM | POA: Diagnosis not present

## 2020-08-06 DIAGNOSIS — W000XXA Fall on same level due to ice and snow, initial encounter: Secondary | ICD-10-CM | POA: Diagnosis not present

## 2020-08-06 DIAGNOSIS — S59902A Unspecified injury of left elbow, initial encounter: Secondary | ICD-10-CM | POA: Diagnosis present

## 2020-08-06 DIAGNOSIS — Z87891 Personal history of nicotine dependence: Secondary | ICD-10-CM | POA: Insufficient documentation

## 2020-08-06 DIAGNOSIS — Z79899 Other long term (current) drug therapy: Secondary | ICD-10-CM | POA: Insufficient documentation

## 2020-08-06 DIAGNOSIS — S5002XA Contusion of left elbow, initial encounter: Secondary | ICD-10-CM | POA: Diagnosis not present

## 2020-08-06 DIAGNOSIS — I10 Essential (primary) hypertension: Secondary | ICD-10-CM | POA: Insufficient documentation

## 2020-08-06 MED ORDER — NAPROXEN 500 MG PO TABS: 500.0000 mg | ORAL_TABLET | Freq: Two times a day (BID) | ORAL | 0 refills | Status: DC

## 2020-08-06 NOTE — Discharge Instructions (Addendum)
Follow up with dr. Harrison next week. °

## 2020-08-06 NOTE — ED Triage Notes (Signed)
Fell on 07/27/2020 on black ice onto asphalt.  Injury to left arm.  Elbow swollen and bruises (yellowish) noted.

## 2020-08-06 NOTE — ED Provider Notes (Signed)
[facility] EMERGENCY DEPARTMENT Provider Note   CSN: [id number] Arrival date & time: 08/06/20  1402     History No chief complaint on file.   Rodney Baldwin is a 50 y.o. male.  Pt states he fell onto his left elbow on 1/19.   Pt with pain in left elbow  The history is provided by the patient and medical records. No language interpreter was used.  Fall This is a new problem. The current episode started more than 1 week ago. The problem occurs rarely. The problem has been resolved. Pertinent negatives include no chest pain, no abdominal pain and no headaches. Nothing aggravates the symptoms. Nothing relieves the symptoms. The treatment provided no relief.       Past Medical History:  Diagnosis Date  . CAD (coronary artery disease)    Occluded ramus intermedius status post DES September 2019  . GERD (gastroesophageal reflux disease)   . Hypertension   . Laryngitis   . NSTEMI (non-ST elevated myocardial infarction) [facility])    September 2019  . Strep throat   . Tonsillitis     There are no problems to display for this patient.   Past Surgical History:  Procedure Laterality Date  . WISDOM TOOTH EXTRACTION         Family History  Problem Relation Age of Onset  . CAD Mother   . Diabetes Mother   . CAD Father     Social History   Tobacco Use  . Smoking status: Former Smoker    Packs/day: 0.50    Types: Cigarettes    Quit date: 03/28/2018    Years since quitting: 2.3  . Smokeless tobacco: Never Used  Vaping Use  . Vaping Use: Every day  . Devices: 2 mg cartridge  Substance Use Topics  . Alcohol use: No  . Drug use: No    Home Medications Prior to Admission medications   Medication Sig Start Date End Date Taking? Authorizing Provider  naproxen (NAPROSYN) 500 MG tablet Take 1 tablet (500 mg total) by mouth 2 (two) times daily. 08/06/20  Yes Milton Ferguson, MD  aspirin 81 MG chewable tablet Chew by mouth. 04/05/18   [provider]  atorvastatin  (LIPITOR) 80 MG tablet Take 80 mg by mouth daily.  04/28/18   [provider]  esomeprazole (NEXIUM) 20 MG capsule Take 20 mg by mouth daily at 12 noon.    [provider]  ibuprofen (ADVIL) 800 MG tablet Take 1 tablet (800 mg total) by mouth every 8 (eight) hours as needed for moderate pain. 05/20/20   Long, Wonda Olds, MD  losartan (COZAAR) 25 MG tablet Take 0.5 tablets (12.5 mg total) by mouth daily. 05/04/20   Satira Sark, MD  metoprolol tartrate (LOPRESSOR) 25 MG tablet Take 12.5 mg by mouth 2 (two) times daily.  04/05/18   [provider]  nitroGLYCERIN (NITROSTAT) 0.4 MG SL tablet DISSOLVE 1 TABLET UNDER THE TONGUE EVERY 5 MINUTES X 3 DOSES AS NEEDED FOR CHEST PAIN (IF NO RELIEF AFTER 2nd DOSE, PROCEED TO THE ER FOR AN EVALUATION OR CALL 911) 07/05/20   Satira Sark, MD  oxyCODONE-acetaminophen (PERCOCET/ROXICET) 5-325 MG tablet Take 1 tablet by mouth every 6 (six) hours as needed for severe pain. 05/20/20   Long, Wonda Olds, MD  senna-docusate (SENOKOT-S) 8.6-50 MG tablet Take 1 tablet by mouth at bedtime as needed for mild constipation or moderate constipation. 05/20/20   Long, Wonda Olds, MD    Allergies  Patient has no known allergies.  Review of Systems   Review of Systems  Constitutional: Negative for appetite change and fatigue.  HENT: Negative for congestion, ear discharge and sinus pressure.   Eyes: Negative for discharge.  Respiratory: Negative for cough.   Cardiovascular: Negative for chest pain.  Gastrointestinal: Negative for abdominal pain and diarrhea.  Genitourinary: Negative for frequency and hematuria.  Musculoskeletal: Negative for back pain.       Left elbow pain  Skin: Negative for rash.  Neurological: Negative for seizures and headaches.  Psychiatric/Behavioral: Negative for hallucinations.    Physical Exam Updated Vital Signs BP (!) 171/108 (BP Location: Left Arm)   Pulse 66   Temp (!) 97 F (36.1 C) (Oral)   Resp 16    Ht 6' (1.829 m)   Wt 87.1 kg   SpO2 100%   BMI 26.04 kg/m   Physical Exam Vitals and nursing note reviewed.  Constitutional:      Appearance: He is well-developed.  HENT:     Head: Normocephalic.  Eyes:     General: No scleral icterus.    Extraocular Movements: EOM normal.     Conjunctiva/sclera: Conjunctivae normal.  Neck:     Thyroid: No thyromegaly.  Cardiovascular:     Rate and Rhythm: Normal rate and regular rhythm.     Heart sounds: No murmur heard. No friction rub. No gallop.   Pulmonary:     Breath sounds: No stridor. No wheezing or rales.  Chest:     Chest wall: No tenderness.  Abdominal:     General: There is no distension.     Tenderness: There is no abdominal tenderness. There is no rebound.  Musculoskeletal:        General: No edema.     Cervical back: Neck supple.     Comments: Tender distal left elbow.   Full range of motion  Lymphadenopathy:     Cervical: No cervical adenopathy.  Skin:    Findings: No erythema or rash.  Neurological:     Mental Status: He is alert and oriented to person, place, and time.     Motor: No abnormal muscle tone.     Coordination: Coordination normal.  Psychiatric:        Mood and Affect: Mood and affect normal.        Behavior: Behavior normal.     ED Results / Procedures / Treatments   Labs (all labs ordered are listed, but only abnormal results are displayed) Labs Reviewed - No data to display  EKG None  Radiology DG Forearm Left  Result Date: 08/06/2020 CLINICAL DATA:  Fall, injury. Fall on ice 07/27/2020 injury to left arm. Arm pain with bruising at the elbow EXAM: LEFT FOREARM - 2 VIEW COMPARISON:  Radiograph 05/20/2020 FINDINGS: The cortical margins of the radius and ulna are intact. There is no evidence of fracture or other focal bone lesions. Wrist and elbow alignment are maintained. No evidence of elbow joint effusion. There is soft tissue edema about the elbow, most prominent posterior medially.  IMPRESSION: Soft tissue edema posterior to the elbow. No fracture of the forearm. Electronically Signed   By: Keith Rake M.D.   On: 08/06/2020 14:54   DG Humerus Left  Result Date: 08/06/2020 CLINICAL DATA:  Fall, injury. Fall on ice 07/27/2020 injury to left arm. Arm pain with bruising at the elbow. EXAM: LEFT HUMERUS - 2+ VIEW COMPARISON:  None. FINDINGS: Cortical margins of the humerus are intact. There is no  evidence of fracture or other focal bone lesions. Shoulder and elbow alignment are maintained. Soft tissue edema posterior to the elbow. No evidence of elbow joint effusion. IMPRESSION: No fracture of the left humerus. Soft tissue edema posterior to the elbow. Electronically Signed   By: Keith Rake M.D.   On: 08/06/2020 14:53    Procedures Procedures   Medications Ordered in ED Medications - No data to display  ED Course  I have reviewed the triage vital signs and the nursing notes.  Pertinent labs & imaging results that were available during my care of the patient were reviewed by me and considered in my medical decision making (see chart for details).    MDM Rules/Calculators/A&P                          X-ray negative.  Pt with contusion to left elbow.  He will follow up with ortho and is given naprosyn Final Clinical Impression(s) / ED Diagnoses Final diagnoses:  Contusion of left elbow, initial encounter    Rx / DC Orders ED Discharge Orders         Ordered    naproxen (NAPROSYN) 500 MG tablet  2 times daily        08/06/20 1527           Milton Ferguson, MD 08/06/20 [phone]

## 2020-10-26 ENCOUNTER — Encounter (HOSPITAL_COMMUNITY): Payer: Self-pay

## 2020-10-26 ENCOUNTER — Observation Stay (HOSPITAL_COMMUNITY)
Admission: EM | Admit: 2020-10-26 | Discharge: 2020-10-27 | Disposition: A | Discharge status: Home / Self Care | Payer: 59 | Attending: Internal Medicine | Admitting: Internal Medicine

## 2020-10-26 ENCOUNTER — Emergency Department (HOSPITAL_COMMUNITY): Payer: 59

## 2020-10-26 ENCOUNTER — Other Ambulatory Visit: Payer: Self-pay

## 2020-10-26 DIAGNOSIS — R079 Chest pain, unspecified: Secondary | ICD-10-CM | POA: Diagnosis present

## 2020-10-26 DIAGNOSIS — I309 Acute pericarditis, unspecified: Secondary | ICD-10-CM

## 2020-10-26 DIAGNOSIS — Z20822 Contact with and (suspected) exposure to covid-19: Secondary | ICD-10-CM | POA: Insufficient documentation

## 2020-10-26 DIAGNOSIS — Z7982 Long term (current) use of aspirin: Secondary | ICD-10-CM | POA: Insufficient documentation

## 2020-10-26 DIAGNOSIS — Z9861 Coronary angioplasty status: Secondary | ICD-10-CM | POA: Insufficient documentation

## 2020-10-26 DIAGNOSIS — R0789 Other chest pain: Secondary | ICD-10-CM | POA: Diagnosis not present

## 2020-10-26 DIAGNOSIS — I10 Essential (primary) hypertension: Secondary | ICD-10-CM | POA: Diagnosis present

## 2020-10-26 DIAGNOSIS — F17210 Nicotine dependence, cigarettes, uncomplicated: Secondary | ICD-10-CM | POA: Diagnosis not present

## 2020-10-26 DIAGNOSIS — Z720 Tobacco use: Secondary | ICD-10-CM | POA: Diagnosis present

## 2020-10-26 DIAGNOSIS — I2510 Atherosclerotic heart disease of native coronary artery without angina pectoris: Secondary | ICD-10-CM | POA: Diagnosis not present

## 2020-10-26 DIAGNOSIS — Z79899 Other long term (current) drug therapy: Secondary | ICD-10-CM | POA: Insufficient documentation

## 2020-10-26 LAB — CBC
HCT: 45.9 % (ref 39.0–52.0)
Hemoglobin: 15.7 g/dL (ref 13.0–17.0)
MCH: 31 pg (ref 26.0–34.0)
MCHC: 34.2 g/dL (ref 30.0–36.0)
MCV: 90.7 fL (ref 80.0–100.0)
Platelets: 178 10*3/uL (ref 150–400)
RBC: 5.06 MIL/uL (ref 4.22–5.81)
RDW: 13.2 % (ref 11.5–15.5)
WBC: 12.8 10*3/uL — ABNORMAL HIGH (ref 4.0–10.5)
nRBC: 0 % (ref 0.0–0.2)

## 2020-10-26 LAB — COMPREHENSIVE METABOLIC PANEL
ALT: 24 U/L (ref 0–44)
AST: 16 U/L (ref 15–41)
Albumin: 4.3 g/dL (ref 3.5–5.0)
Alkaline Phosphatase: 72 U/L (ref 38–126)
Anion gap: 10 (ref 5–15)
BUN: 16 mg/dL (ref 6–20)
CO2: 25 mmol/L (ref 22–32)
Calcium: 9.3 mg/dL (ref 8.9–10.3)
Chloride: 102 mmol/L (ref 98–111)
Creatinine, Ser: 0.97 mg/dL (ref 0.61–1.24)
GFR, Estimated: >60 mL/min (ref >60)
Glucose, Bld: 109 mg/dL — ABNORMAL HIGH (ref 70–99)
Potassium: 3.6 mmol/L (ref 3.5–5.1)
Sodium: 137 mmol/L (ref 135–145)
Total Bilirubin: 0.9 mg/dL (ref 0.3–1.2)
Total Protein: 7.7 g/dL (ref 6.5–8.1)

## 2020-10-26 LAB — C-REACTIVE PROTEIN: CRP: 11.2 mg/dL — ABNORMAL HIGH (ref <1.0)

## 2020-10-26 LAB — APTT: aPTT: 29 seconds (ref 24–36)

## 2020-10-26 LAB — TROPONIN I (HIGH SENSITIVITY)
Troponin I (High Sensitivity): 3 ng/L (ref <18)
Troponin I (High Sensitivity): 3 ng/L (ref <18)
Troponin I (High Sensitivity): 4 ng/L (ref <18)

## 2020-10-26 LAB — RESP PANEL BY RT-PCR (FLU A&B, COVID) ARPGX2
Influenza A by PCR: NEGATIVE
Influenza B by PCR: NEGATIVE
SARS Coronavirus 2 by RT PCR: NEGATIVE

## 2020-10-26 LAB — PROTIME-INR
INR: 1 (ref 0.8–1.2)
Prothrombin Time: 13.4 seconds (ref 11.4–15.2)

## 2020-10-26 LAB — SEDIMENTATION RATE: Sed Rate: 14 mm/hr (ref 0–16)

## 2020-10-26 LAB — CBG MONITORING, ED: Glucose-Capillary: 125 mg/dL — ABNORMAL HIGH (ref 70–99)

## 2020-10-26 MED ORDER — COLCHICINE 0.6 MG PO TABS
1.2000 mg | ORAL_TABLET | Freq: Two times a day (BID) | ORAL | Status: AC
  Administered 2020-10-26 – 2020-10-27 (×2): 1.2 mg via ORAL
  Filled 2020-10-26 (×2): qty 2

## 2020-10-26 MED ORDER — ONDANSETRON HCL 4 MG/2ML IJ SOLN: 4.0000 mg | Freq: Four times a day (QID) | INTRAMUSCULAR | Status: DC | PRN

## 2020-10-26 MED ORDER — METOPROLOL TARTRATE 25 MG PO TABS
12.5000 mg | ORAL_TABLET | Freq: Two times a day (BID) | ORAL | Status: DC
  Administered 2020-10-27: 12.5 mg via ORAL
  Filled 2020-10-26: qty 1

## 2020-10-26 MED ORDER — ATORVASTATIN CALCIUM 40 MG PO TABS
80.0000 mg | ORAL_TABLET | Freq: Every day | ORAL | Status: DC
  Administered 2020-10-27: 80 mg via ORAL
  Filled 2020-10-26: qty 2

## 2020-10-26 MED ORDER — IBUPROFEN 600 MG PO TABS
600.0000 mg | ORAL_TABLET | Freq: Three times a day (TID) | ORAL | Status: DC
  Administered 2020-10-26 – 2020-10-27 (×2): 600 mg via ORAL
  Filled 2020-10-26 (×2): qty 1

## 2020-10-26 MED ORDER — ACETAMINOPHEN 325 MG PO TABS: 650.0000 mg | ORAL_TABLET | Freq: Four times a day (QID) | ORAL | Status: DC | PRN

## 2020-10-26 MED ORDER — ENOXAPARIN SODIUM 40 MG/0.4ML ~~LOC~~ SOLN
40.0000 mg | SUBCUTANEOUS | Status: DC
  Administered 2020-10-26: 40 mg via SUBCUTANEOUS
  Filled 2020-10-26: qty 0.4

## 2020-10-26 MED ORDER — IOHEXOL 350 MG/ML SOLN
75.0000 mL | Freq: Once | INTRAVENOUS | Status: AC | PRN
  Administered 2020-10-26: 75 mL via INTRAVENOUS

## 2020-10-26 MED ORDER — ASPIRIN 81 MG PO CHEW
81.0000 mg | CHEWABLE_TABLET | Freq: Every day | ORAL | Status: DC
  Administered 2020-10-27: 81 mg via ORAL
  Filled 2020-10-26: qty 1

## 2020-10-26 MED ORDER — MORPHINE SULFATE (PF) 4 MG/ML IV SOLN
INTRAVENOUS | Status: AC
  Administered 2020-10-26: 4 mg via INTRAVENOUS
  Filled 2020-10-26: qty 1

## 2020-10-26 MED ORDER — ONDANSETRON HCL 4 MG PO TABS: 4.0000 mg | ORAL_TABLET | Freq: Four times a day (QID) | ORAL | Status: DC | PRN

## 2020-10-26 MED ORDER — ASPIRIN 81 MG PO CHEW
324.0000 mg | CHEWABLE_TABLET | Freq: Once | ORAL | Status: AC
  Administered 2020-10-26: 324 mg via ORAL

## 2020-10-26 MED ORDER — NITROGLYCERIN 2 % TD OINT
1.0000 [in_us] | TOPICAL_OINTMENT | Freq: Once | TRANSDERMAL | Status: AC
  Administered 2020-10-26: 1 [in_us] via TOPICAL
  Filled 2020-10-26: qty 1

## 2020-10-26 MED ORDER — MORPHINE SULFATE (PF) 4 MG/ML IV SOLN: 4.0000 mg | Freq: Once | INTRAVENOUS | Status: AC

## 2020-10-26 MED ORDER — POLYETHYLENE GLYCOL 3350 17 G PO PACK: 17.0000 g | PACK | Freq: Every day | ORAL | Status: DC | PRN

## 2020-10-26 MED ORDER — ACETAMINOPHEN 650 MG RE SUPP: 650.0000 mg | Freq: Four times a day (QID) | RECTAL | Status: DC | PRN

## 2020-10-26 NOTE — ED Triage Notes (Signed)
Pt to er room number two, pt reports chest pain since last night around midnight.  Pt reports a constant pain, states that deep breathing makes the pain worse, states that laying down makes the pain a little bit better.

## 2020-10-26 NOTE — ED Notes (Signed)
Patient transported to CT 

## 2020-10-26 NOTE — H&P (Signed)
History and Physical    Rodney Baldwin CWC:[id number] DOB: [birth year] DOA: 10/26/2020  PCP: Cory Munch, PA-C   Patient coming from: Home I have personally briefly reviewed patient's old medical records in [place]  Chief Complaint: Chest Pain  HPI: Rodney Baldwin is a 50 y.o. male with medical history significant for coronary artery disease, hypertension, tobacco use. Patient presented to the Ed with complaints of mid to left-sided chest pains that started last night.  Patient was sleeping when he is around woke him up, he was getting up when he noticed that having chest pains which he describes as a throbbing/ache, he describes a feeling of not being able to take a deep breath.   Chest pain is worse when he is on his lying back or bending over to touch his toes, and improves when he is sitting up in a reclining position.  He reports chest pains with each step when he is walking.  He also reports some shoulder pains. He denies associated nausea, or palpitations.   No recent fevers, no recent viral illness, no nasal congestion or cough.  Patient took 2 doses of ibuprofen 800 mg, this helped with the pain but did not completely resolve it.  He has since had nitroglycerin and morphine , both of which have not helped.  He reports this chest pain is different from the chest pain he had in 2019 when he had a heart attack.  Patient is a Administrator.  Ongoing tobacco use.  ED Course: Temperature 98.1.  Heart rate 70s.  Respiratory rate 18 -24.  Blood pressure systolic 76-160.  O2 sat greater than 96% on room air.  Troponin 3.  EKG shows sinus rhythm, QTC 415.  Mild diffuse ST elevation.  Aspirin 325 and nitro glycerin ointment given.   Hospitalist to admit.  Review of Systems: As per HPI all other systems reviewed and negative.  Past Medical History:  Diagnosis Date  . CAD (coronary artery disease)    Occluded ramus intermedius status post DES September 2019  . GERD (gastroesophageal  reflux disease)   . Hypertension   . Laryngitis   . NSTEMI (non-ST elevated myocardial infarction) [facility])    September 2019  . Strep throat   . Tonsillitis     Past Surgical History:  Procedure Laterality Date  . CORONARY ANGIOPLASTY WITH STENT PLACEMENT    . WISDOM TOOTH EXTRACTION       reports that he has been smoking cigarettes. He has been smoking about 1.00 pack per day. He has never used smokeless tobacco. He reports that he does not drink alcohol and does not use drugs.  No Known Allergies  Family History  Problem Relation Age of Onset  . CAD Mother   . Diabetes Mother   . CAD Father     Prior to Admission medications   Medication Sig Start Date End Date Taking? Authorizing Provider  aspirin 81 MG chewable tablet Chew by mouth. 04/05/18   [provider]  atorvastatin (LIPITOR) 80 MG tablet Take 80 mg by mouth daily.  04/28/18   [provider]  esomeprazole (NEXIUM) 20 MG capsule Take 20 mg by mouth daily at 12 noon.    [provider]  ibuprofen (ADVIL) 800 MG tablet Take 1 tablet (800 mg total) by mouth every 8 (eight) hours as needed for moderate pain. 05/20/20   Long, Wonda Olds, MD  losartan (COZAAR) 25 MG tablet Take 0.5 tablets (12.5 mg total) by  mouth daily. 05/04/20   Satira Sark, MD  metoprolol tartrate (LOPRESSOR) 25 MG tablet Take 12.5 mg by mouth 2 (two) times daily.  04/05/18   [provider]  naproxen (NAPROSYN) 500 MG tablet Take 1 tablet (500 mg total) by mouth 2 (two) times daily. 08/06/20   Milton Ferguson, MD  nitroGLYCERIN (NITROSTAT) 0.4 MG SL tablet DISSOLVE 1 TABLET UNDER THE TONGUE EVERY 5 MINUTES X 3 DOSES AS NEEDED FOR CHEST PAIN (IF NO RELIEF AFTER 2nd DOSE, PROCEED TO THE ER FOR AN EVALUATION OR CALL 911) 07/05/20   Satira Sark, MD  oxyCODONE-acetaminophen (PERCOCET/ROXICET) 5-325 MG tablet Take 1 tablet by mouth every 6 (six) hours as needed for severe pain. 05/20/20   Long, Wonda Olds, MD   senna-docusate (SENOKOT-S) 8.6-50 MG tablet Take 1 tablet by mouth at bedtime as needed for mild constipation or moderate constipation. 05/20/20   Margette Fast, MD    Physical Exam: Vitals:   10/26/20 1800 10/26/20 1913 10/26/20 1930 10/26/20 2000  BP: 98/68 (!) 98/59 99/62 106/72  Pulse: 73 71 71 73  Resp: 18 19 (!) 24 (!) 23  Temp:      TempSrc:      SpO2: 96% 96% 96% 97%  Weight:      Height:        Constitutional: NAD, calm, comfortable Vitals:   10/26/20 1800 10/26/20 1913 10/26/20 1930 10/26/20 2000  BP: 98/68 (!) 98/59 99/62 106/72  Pulse: 73 71 71 73  Resp: 18 19 (!) 24 (!) 23  Temp:      TempSrc:      SpO2: 96% 96% 96% 97%  Weight:      Height:       Eyes: PERRL, lids and conjunctivae normal ENMT: Mucous membranes are moist.  Neck: normal, supple, no masses, no thyromegaly Respiratory: clear to auscultation bilaterally, no wheezing, no crackles. Normal respiratory effort. No accessory muscle use.  Cardiovascular: Regular rate and rhythm, no murmurs / rubs / gallops. No extremity edema. 2+ pedal pulses.   Abdomen: no tenderness, no masses palpated. No hepatosplenomegaly. Bowel sounds positive.  Musculoskeletal: no clubbing / cyanosis. No joint deformity upper and lower extremities. Good ROM, no contractures. Normal muscle tone.  Skin: no rashes, lesions, ulcers. No induration Neurologic: No apparent current abnormality, moving extremities spontaneously. Psychiatric: Normal judgment and insight. Alert and oriented x 3. Normal mood.   Labs on Admission: I have personally reviewed following labs and imaging studies  CBC: Recent Labs  Lab 10/26/20 1632  WBC 12.8*  HGB 15.7  HCT 45.9  MCV 90.7  PLT 629   Basic Metabolic Panel: Recent Labs  Lab 10/26/20 1632  NA 137  K 3.6  CL 102  CO2 25  GLUCOSE 109*  BUN 16  CREATININE 0.97  CALCIUM 9.3   Liver Function Tests: Recent Labs  Lab 10/26/20 1632  AST 16  ALT 24  ALKPHOS 72  BILITOT 0.9  PROT  7.7  ALBUMIN 4.3   Coagulation Profile: Recent Labs  Lab 10/26/20 1632  INR 1.0   CBG: Recent Labs  Lab 10/26/20 1705  GLUCAP 125*    Radiological Exams on Admission: CT Angio Chest PE W and/or Wo Contrast  Result Date: 10/26/2020 CLINICAL DATA:  Pleuritic chest pain EXAM: CT ANGIOGRAPHY CHEST WITH CONTRAST TECHNIQUE: Multidetector CT imaging of the chest was performed using the standard protocol during bolus administration of intravenous contrast. Multiplanar CT image reconstructions and MIPs were obtained to evaluate the vascular  anatomy. CONTRAST:  8m OMNIPAQUE IOHEXOL 350 MG/ML SOLN COMPARISON:  None. FINDINGS: Cardiovascular: No filling defects in the pulmonary arteries to suggest pulmonary emboli. Scattered coronary artery and aortic calcifications. Heart is mildly enlarged. No aneurysm. Mediastinum/Nodes: No mediastinal, hilar, or axillary adenopathy. Trachea and esophagus are unremarkable. Thyroid unremarkable. Lungs/Pleura: Bibasilar atelectasis.  No effusions. Upper Abdomen: Imaging into the upper abdomen demonstrates no acute findings. Musculoskeletal: Chest wall soft tissues are unremarkable. No acute bony abnormality. Review of the MIP images confirms the above findings. IMPRESSION: No evidence of pulmonary embolus. Cardiomegaly, scattered coronary artery disease. Bibasilar atelectasis. Aortic Atherosclerosis (ICD10-I70.0). Electronically Signed   By: KRolm BaptiseM.D.   On: 10/26/2020 19:15   DG Chest Portable 1 View  Result Date: 10/26/2020 CLINICAL DATA:  Chest pain EXAM: PORTABLE CHEST 1 VIEW COMPARISON:  01/06/2008 FINDINGS: Linear increased markings in the lung bases could reflect atelectasis or scarring. Heart is upper limits normal in size. No effusions or confluent opacities. No acute bony abnormality. IMPRESSION: Linear bibasilar scarring or atelectasis.  No active disease. Electronically Signed   By: KRolm BaptiseM.D.   On: 10/26/2020 17:10    EKG: Independently  reviewed.  Sinus rhythm rate 81, QTc 415.  Assessment/Plan Principal Problem:   Chest pain Active Problems:   CAD (coronary artery disease)   HTN (hypertension)   Tobacco abuse   Chest pain with coronary artery disease history- troponin unremarkable, chest pain improves with sitting in reclining position, ibuprofen only medication that has so far has helped with pain.  No recent viral infection, COVID and influenza test negative.  CTA chest negative for PE.  History of NSTEMI 2019 had DES to occluded ramus intermedius. -Aspirin 325 mg given, continue 81 mg daily -Continue atorvastatin -Ibuprofen as needed -Obtain ESR, CRP. -Obtain echocardiogram -Resume  Metoprolol -Hold losartan with contrast exposure - I Talked to Cardiologist- Dr AMadelon Lips he agrees that there are diffuse ST elevations, agreed with echo, checking inflammatory markers, trend troponin, and can start ibuprofen and colchicine.  With formal cardiology consult in the morning. -Started ibuprofen 600 3 times daily -Started colchicine 1.2 mg x 2 loading dose.  Subsequent dosing pending cardiology evaluation. - Cardiology consult in a.m - NPO midnight  HTN-blood pressure soft systolic [id number] -Resume losartan 12.5 mg daily, metoprolol 12.5 twice daily  Tobacco abuse-reports he smokes less than 1/4 pack daily -Counseled on cessation.   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 Disposition Plan: ~ 1-2 days Consults called: Cardiology Admission status: Obs, tele   EBethena RoysMD [facility]  10/26/2020, 9:47 PM

## 2020-10-26 NOTE — ED Provider Notes (Signed)
[facility] Provider Note   CSN: [id number] Arrival date & time: 10/26/20  1631     History Chief Complaint  Patient presents with  . Chest Pain    Rodney Baldwin is a 50 y.o. male.  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  The chest pain is seemingly sharp, seems to get worse with a deep breath, it is also worse with exertion, there is no associated coughing fevers or swelling, no nausea or vomiting, or diaphoresis.  He feels like this may be similar to what it felt like in September 2019.  He was on Brilinta until 2 months ago when it was discontinued  Review of the medical record shows that he was actually discontinued in October 2021 on Brilinta and had his losartan dose to reduce to 12.5 mg daily  The patient still smokes 1 pack of cigarettes per day  Past Medical History:  Diagnosis Date  . CAD (coronary artery disease)    Occluded ramus intermedius status post DES September 2019  . GERD (gastroesophageal reflux disease)   . Hypertension   . Laryngitis   . NSTEMI (non-ST elevated myocardial infarction) [facility])    September 2019  . Strep throat   . Tonsillitis     There are no problems to display for this patient.   Past Surgical History:  Procedure Laterality Date  . CORONARY ANGIOPLASTY WITH STENT PLACEMENT    . WISDOM TOOTH EXTRACTION         Family History  Problem Relation Age of Onset  . CAD Mother   . Diabetes Mother   . CAD Father     Social History   Tobacco Use  . Smoking status: Current Every Day Smoker    Packs/day: 1.00     Types: Cigarettes  . Smokeless tobacco: Never Used  Vaping Use  . Vaping Use: Former  . Devices: 2 mg cartridge  Substance Use Topics  . Alcohol use: No  . Drug use: No    Home Medications Prior to Admission medications   Medication Sig Start Date End Date Taking? Authorizing Provider  aspirin 81 MG chewable tablet Chew by mouth. 04/05/18   [provider]  atorvastatin (LIPITOR) 80 MG tablet Take 80 mg by mouth daily.  04/28/18   [provider]  esomeprazole (NEXIUM) 20 MG capsule Take 20 mg by mouth daily at 12 noon.    [provider]  ibuprofen (ADVIL) 800 MG tablet Take 1 tablet (800 mg total) by mouth every 8 (eight) hours as needed for moderate pain. 05/20/20   Long, Wonda Olds, MD  losartan (COZAAR) 25 MG tablet Take 0.5 tablets (12.5 mg total) by mouth daily. 05/04/20   Satira Sark, MD  metoprolol tartrate (LOPRESSOR) 25 MG tablet Take 12.5 mg by mouth 2 (two) times daily.  04/05/18   [provider]  naproxen (NAPROSYN) 500 MG tablet Take 1 tablet (500 mg total) by mouth 2 (two) times daily. 08/06/20   Milton Ferguson, MD  nitroGLYCERIN (NITROSTAT) 0.4  MG SL tablet DISSOLVE 1 TABLET UNDER THE TONGUE EVERY 5 MINUTES X 3 DOSES AS NEEDED FOR CHEST PAIN (IF NO RELIEF AFTER 2nd DOSE, PROCEED TO THE ER FOR AN EVALUATION OR CALL 911) 07/05/20   Satira Sark, MD  oxyCODONE-acetaminophen (PERCOCET/ROXICET) 5-325 MG tablet Take 1 tablet by mouth every 6 (six) hours as needed for severe pain. 05/20/20   Long, Wonda Olds, MD  senna-docusate (SENOKOT-S) 8.6-50 MG tablet Take 1 tablet by mouth at bedtime as needed for mild constipation or moderate constipation. 05/20/20   Long, Wonda Olds, MD    Allergies    Patient has no known allergies.  Review of Systems   Review of Systems  All other systems reviewed and are negative.   Physical Exam Updated Vital Signs BP (!) 98/59   Pulse 71   Temp 98.1 F (36.7 C) (Oral)   Resp 19   Ht 1.829 m (6')    Wt 87.1 kg   SpO2 96%   BMI 26.04 kg/m   Physical Exam Vitals and nursing note reviewed.  Constitutional:      General: He is not in acute distress.    Appearance: He is well-developed.  HENT:     Head: Normocephalic and atraumatic.     Mouth/Throat:     Pharynx: No oropharyngeal exudate.  Eyes:     General: No scleral icterus.       Right eye: No discharge.        Left eye: No discharge.     Conjunctiva/sclera: Conjunctivae normal.     Pupils: Pupils are equal, round, and reactive to light.  Neck:     Thyroid: No thyromegaly.     Vascular: No JVD.  Cardiovascular:     Rate and Rhythm: Normal rate and regular rhythm.     Heart sounds: Normal heart sounds. No murmur heard. No friction rub. No gallop.   Pulmonary:     Effort: Pulmonary effort is normal. No respiratory distress.     Breath sounds: Normal breath sounds. No wheezing or rales.  Abdominal:     General: Bowel sounds are normal. There is no distension.     Palpations: Abdomen is soft. There is no mass.     Tenderness: There is no abdominal tenderness.  Musculoskeletal:        General: No tenderness. Normal range of motion.     Cervical back: Normal range of motion and neck supple.     Comments: Lipoma small mass to the inner R calf near knee  Lymphadenopathy:     Cervical: No cervical adenopathy.  Skin:    General: Skin is warm and dry.     Findings: No erythema or rash.  Neurological:     Mental Status: He is alert.     Coordination: Coordination normal.  Psychiatric:        Behavior: Behavior normal.     ED Results / Procedures / Treatments   Labs (all labs ordered are listed, but only abnormal results are displayed) Labs Reviewed  COMPREHENSIVE METABOLIC PANEL - Abnormal; Notable for the following components:      Result Value   Glucose, Bld 109 (*)    All other components within normal limits  CBC - Abnormal; Notable for the following components:   WBC 12.8 (*)    All other components within  normal limits  CBG MONITORING, ED - Abnormal; Notable for the following components:   Glucose-Capillary 125 (*)    All other components within  normal limits  APTT  PROTIME-INR  TROPONIN I (HIGH SENSITIVITY)  TROPONIN I (HIGH SENSITIVITY)    EKG EKG Interpretation  Date/Time:  Wednesday October 26 2020 16:45:41 EDT Ventricular Rate:  81 PR Interval:  175 QRS Duration: 97 QT Interval:  357 QTC Calculation: 415 R Axis:   52 Text Interpretation: Sinus rhythm ST elevation suggests acute pericarditis no reciprocal changes Confirmed by Noemi Chapel [phone]) on 10/26/2020 4:48:46 PM   Radiology CT Angio Chest PE W and/or Wo Contrast  Result Date: 10/26/2020 CLINICAL DATA:  Pleuritic chest pain EXAM: CT ANGIOGRAPHY CHEST WITH CONTRAST TECHNIQUE: Multidetector CT imaging of the chest was performed using the standard protocol during bolus administration of intravenous contrast. Multiplanar CT image reconstructions and MIPs were obtained to evaluate the vascular anatomy. CONTRAST:  29mL OMNIPAQUE IOHEXOL 350 MG/ML SOLN COMPARISON:  None. FINDINGS: Cardiovascular: No filling defects in the pulmonary arteries to suggest pulmonary emboli. Scattered coronary artery and aortic calcifications. Heart is mildly enlarged. No aneurysm. Mediastinum/Nodes: No mediastinal, hilar, or axillary adenopathy. Trachea and esophagus are unremarkable. Thyroid unremarkable. Lungs/Pleura: Bibasilar atelectasis.  No effusions. Upper Abdomen: Imaging into the upper abdomen demonstrates no acute findings. Musculoskeletal: Chest wall soft tissues are unremarkable. No acute bony abnormality. Review of the MIP images confirms the above findings. IMPRESSION: No evidence of pulmonary embolus. Cardiomegaly, scattered coronary artery disease. Bibasilar atelectasis. Aortic Atherosclerosis (ICD10-I70.0). Electronically Signed   By: Rolm Baptise M.D.   On: 10/26/2020 19:15   DG Chest Portable 1 View  Result Date: 10/26/2020 CLINICAL  DATA:  Chest pain EXAM: PORTABLE CHEST 1 VIEW COMPARISON:  01/06/2008 FINDINGS: Linear increased markings in the lung bases could reflect atelectasis or scarring. Heart is upper limits normal in size. No effusions or confluent opacities. No acute bony abnormality. IMPRESSION: Linear bibasilar scarring or atelectasis.  No active disease. Electronically Signed   By: Rolm Baptise M.D.   On: 10/26/2020 17:10    Procedures Procedures   Medications Ordered in ED Medications  aspirin chewable tablet 324 mg (324 mg Oral Given 10/26/20 1659)  nitroGLYCERIN (NITROGLYN) 2 % ointment 1 inch (1 inch Topical Given 10/26/20 1701)  morphine 4 MG/ML injection 4 mg (4 mg Intravenous Given 10/26/20 1800)  iohexol (OMNIPAQUE) 350 MG/ML injection 75 mL (75 mLs Intravenous Contrast Given 10/26/20 1844)    ED Course  I have reviewed the triage vital signs and the nursing notes.  Pertinent labs & imaging results that were available during my care of the patient were reviewed by me and considered in my medical decision making (see chart for details).    MDM Rules/Calculators/A&P                          Pt appears in no distress, but has ongoing pain ASA and nitro paste given Labs and O2 - CXR, pt has possible UA or ACS.  Troponin is negative, repeat troponin is also negative, the EKG could be consistent with pericarditis or early ischemia, discussed with the hospitalist will admit.  CT scan shows no signs of acute pulmonary embolism or other abnormal lung findings  Interventions include aspirin and nitroglycerin and morphine  Final Clinical Impression(s) / ED Diagnoses Final diagnoses:  Acute pericarditis, unspecified type  Left-sided chest pain      Noemi Chapel, MD 10/26/20 1940

## 2020-10-27 ENCOUNTER — Observation Stay (HOSPITAL_BASED_OUTPATIENT_CLINIC_OR_DEPARTMENT_OTHER): Payer: 59

## 2020-10-27 DIAGNOSIS — Z720 Tobacco use: Secondary | ICD-10-CM

## 2020-10-27 DIAGNOSIS — I300 Acute nonspecific idiopathic pericarditis: Secondary | ICD-10-CM

## 2020-10-27 DIAGNOSIS — I10 Essential (primary) hypertension: Secondary | ICD-10-CM | POA: Diagnosis not present

## 2020-10-27 DIAGNOSIS — E785 Hyperlipidemia, unspecified: Secondary | ICD-10-CM

## 2020-10-27 DIAGNOSIS — R079 Chest pain, unspecified: Secondary | ICD-10-CM | POA: Diagnosis not present

## 2020-10-27 LAB — ECHOCARDIOGRAM COMPLETE
AR max vel: 2.53 cm2
AV Area VTI: 2.35 cm2
AV Area mean vel: 2.35 cm2
AV Mean grad: 3.1 mmHg
AV Peak grad: 6.6 mmHg
Ao pk vel: 1.29 m/s
Area-P 1/2: 2.31 cm2
Height: 72 in
S' Lateral: 2.85 cm
Weight: 3072 oz

## 2020-10-27 LAB — TROPONIN I (HIGH SENSITIVITY): Troponin I (High Sensitivity): 4 ng/L (ref <18)

## 2020-10-27 LAB — HIV ANTIBODY (ROUTINE TESTING W REFLEX): HIV Screen 4th Generation wRfx: NONREACTIVE

## 2020-10-27 MED ORDER — COLCHICINE 0.6 MG PO TABS: 0.6000 mg | ORAL_TABLET | Freq: Two times a day (BID) | ORAL | Status: DC

## 2020-10-27 MED ORDER — IBUPROFEN 600 MG PO TABS: 600.0000 mg | ORAL_TABLET | Freq: Three times a day (TID) | ORAL | 0 refills | Status: AC

## 2020-10-27 MED ORDER — COLCHICINE 0.6 MG PO TABS: 0.6000 mg | ORAL_TABLET | Freq: Two times a day (BID) | ORAL | 2 refills | Status: DC

## 2020-10-27 NOTE — Discharge Summary (Addendum)
Physician Discharge Summary  [name] XQJ:[id number] DOB: 08-14-1970 DOA: 10/26/2020  PCP: Cory Munch, PA-C  Admit date: 10/26/2020  Discharge date: 10/27/2020  Admitted From:Home  Disposition:  Home  Recommendations for Outpatient Follow-up:  1. Follow up with PCP in 1-2 weeks 2. Follow-up with cardiologist Dr. Domenic Polite in 2-3 weeks 3. Continue on ibuprofen as prescribed for 7 days as well as colchicine as prescribed for 3 months for treatment of pericarditis 4. Continue on other home medications as before and hold Losartan given softer BP readings. May resume after further follow up.  Home Health: None  Equipment/Devices: None  Discharge Condition:Stable  CODE STATUS: Full  Diet recommendation: Heart Healthy  Brief/Interim Summary: Rodney Baldwin is a 50 y.o. male with medical history significant for coronary artery disease, hypertension, tobacco use. Patient presented to the Ed with complaints of mid to left-sided chest pains that started on the evening of admission.  He had clinical features suggestive of acute pericarditis and was started on treatment with ibuprofen as well as colchicine.  He was seen by cardiology and had 2D echocardiogram performed with LVEF 55-60% and no other acute findings noted.  He will remain on colchicine and ibuprofen as prescribed for treatment as noted above and is noted to be in stable condition for discharge today.  No other acute events noted throughout the course of this admission.  Discharge Diagnoses:  Principal Problem:   Chest pain Active Problems:   CAD (coronary artery disease)   HTN (hypertension)   Tobacco abuse  Principal discharge diagnosis: Chest pain secondary to acute pericarditis.  Discharge Instructions  Discharge Instructions    Diet - low sodium heart healthy   Complete by: As directed    Increase activity slowly   Complete by: As directed      Allergies as of 10/27/2020   No Known Allergies      Medication List    STOP taking these medications   losartan 25 MG tablet Commonly known as: COZAAR   naproxen 500 MG tablet Commonly known as: NAPROSYN     TAKE these medications   aspirin 81 MG chewable tablet Chew 162 mg by mouth daily.   atorvastatin 80 MG tablet Commonly known as: LIPITOR Take 80 mg by mouth daily.   colchicine 0.6 MG tablet Take 1 tablet (0.6 mg total) by mouth 2 (two) times daily.   esomeprazole 20 MG capsule Commonly known as: NEXIUM Take 20 mg by mouth daily at 12 noon.   ibuprofen 600 MG tablet Commonly known as: ADVIL Take 1 tablet (600 mg total) by mouth 3 (three) times daily for 7 days. What changed:   medication strength  how much to take  when to take this  reasons to take this   metoprolol tartrate 25 MG tablet Commonly known as: LOPRESSOR Take 12.5 mg by mouth 2 (two) times daily.   nitroGLYCERIN 0.4 MG SL tablet Commonly known as: NITROSTAT DISSOLVE 1 TABLET UNDER THE TONGUE EVERY 5 MINUTES X 3 DOSES AS NEEDED FOR CHEST PAIN (IF NO RELIEF AFTER 2nd DOSE, PROCEED TO THE ER FOR AN EVALUATION OR CALL 911) What changed: See the new instructions.   oxyCODONE-acetaminophen 5-325 MG tablet Commonly known as: PERCOCET/ROXICET Take 1 tablet by mouth every 6 (six) hours as needed for severe pain.   senna-docusate 8.6-50 MG tablet Commonly known as: Senokot-S Take 1 tablet by mouth at bedtime as needed for mild constipation or moderate constipation.       Follow-up  Information    Cory Munch, PA-C. Schedule an appointment as soon as possible for a visit in 1 week(s).   Specialties: Physician Assistant, Internal Medicine Contact information: [place] [address] [phone]        Satira Sark, MD. Schedule an appointment as soon as possible for a visit in 2 week(s).   Specialty: Cardiology Contact information: [place] [address] [phone]               No Known Allergies  Consultations:  Cardiology   Procedures/Studies: CT Angio Chest PE W and/or Wo Contrast  Result Date: 10/26/2020 CLINICAL DATA:  Pleuritic chest pain EXAM: CT ANGIOGRAPHY CHEST WITH CONTRAST TECHNIQUE: Multidetector CT imaging of the chest was performed using the standard protocol during bolus administration of intravenous contrast. Multiplanar CT image reconstructions and MIPs were obtained to evaluate the vascular anatomy. CONTRAST:  29mL OMNIPAQUE IOHEXOL 350 MG/ML SOLN COMPARISON:  None. FINDINGS: Cardiovascular: No filling defects in the pulmonary arteries to suggest pulmonary emboli. Scattered coronary artery and aortic calcifications. Heart is mildly enlarged. No aneurysm. Mediastinum/Nodes: No mediastinal, hilar, or axillary adenopathy. Trachea and esophagus are unremarkable. Thyroid unremarkable. Lungs/Pleura: Bibasilar atelectasis.  No effusions. Upper Abdomen: Imaging into the upper abdomen demonstrates no acute findings. Musculoskeletal: Chest wall soft tissues are unremarkable. No acute bony abnormality. Review of the MIP images confirms the above findings. IMPRESSION: No evidence of pulmonary embolus. Cardiomegaly, scattered coronary artery disease. Bibasilar atelectasis. Aortic Atherosclerosis (ICD10-I70.0). Electronically Signed   By: Rolm Baptise M.D.   On: 10/26/2020 19:15   DG Chest Portable 1 View  Result Date: 10/26/2020 CLINICAL DATA:  Chest pain EXAM: PORTABLE CHEST 1 VIEW COMPARISON:  01/06/2008 FINDINGS: Linear increased markings in the lung bases could reflect atelectasis or scarring. Heart is upper limits normal in size. No effusions or confluent opacities. No acute bony abnormality. IMPRESSION: Linear bibasilar scarring or atelectasis.  No active disease. Electronically Signed   By: Rolm Baptise M.D.   On: 10/26/2020 17:10   ECHOCARDIOGRAM COMPLETE  Result Date: 10/27/2020    ECHOCARDIOGRAM REPORT   Patient Name:   Rodney Baldwin Date of Exam:  10/27/2020 Medical Rec #:  [id number]     Height:       72.0 in Accession #:    [id number]    Weight:       192.0 lb Date of Birth:  [birth year]     BSA:          2.094 m Patient Age:    50 years      BP:           105/80 mmHg Patient Gender: M             HR:           61 bpm. Exam Location:  [facility] Procedure: 2D Echo Indications:    Chest Pain R07.9  History:        Patient has no prior history of Echocardiogram examinations. CAD                 and Previous Myocardial Infarction, Signs/Symptoms:Chest Pain;                 Risk Factors:Hypertension and Current Smoker. GERD.  Sonographer:    Leavy Cella RDCS (AE) Referring Phys: [place]  1. Left ventricular ejection fraction, by estimation, is 55 to 60%. The left ventricle has normal function. The  left ventricle has no regional wall motion abnormalities. There is mild left ventricular hypertrophy. Left ventricular diastolic parameters were normal.  2. Right ventricular systolic function is normal. The right ventricular size is normal.  3. The mitral valve is normal in structure. No evidence of mitral valve regurgitation. No evidence of mitral stenosis.  4. The aortic valve is tricuspid. Aortic valve regurgitation is not visualized. No aortic stenosis is present.  5. The inferior vena cava is normal in size with <50% respiratory variability, suggesting right atrial pressure of 8 mmHg. FINDINGS  Left Ventricle: Left ventricular ejection fraction, by estimation, is 55 to 60%. The left ventricle has normal function. The left ventricle has no regional wall motion abnormalities. The left ventricular internal cavity size was normal in size. There is  mild left ventricular hypertrophy. Left ventricular diastolic parameters were normal. Right Ventricle: The right ventricular size is normal. No increase in right ventricular wall thickness. Right ventricular systolic function is normal. Left Atrium: Left atrial size was normal in size. Right  Atrium: Right atrial size was normal in size. Pericardium: There is no evidence of pericardial effusion. Mitral Valve: The mitral valve is normal in structure. No evidence of mitral valve regurgitation. No evidence of mitral valve stenosis. Tricuspid Valve: The tricuspid valve is normal in structure. Tricuspid valve regurgitation is trivial. No evidence of tricuspid stenosis. Aortic Valve: The aortic valve is tricuspid. Aortic valve regurgitation is not visualized. No aortic stenosis is present. Aortic valve mean gradient measures 3.1 mmHg. Aortic valve peak gradient measures 6.6 mmHg. Aortic valve area, by VTI measures 2.35 cm. Pulmonic Valve: The pulmonic valve was not well visualized. Pulmonic valve regurgitation is not visualized. No evidence of pulmonic stenosis. Aorta: The aortic root is normal in size and structure. Pulmonary Artery: Indeterminant PASP, inadequate TR jet. Venous: The inferior vena cava is normal in size with less than 50% respiratory variability, suggesting right atrial pressure of 8 mmHg. IAS/Shunts: No atrial level shunt detected by color flow Doppler.  LEFT VENTRICLE PLAX 2D LVIDd:         5.56 cm  Diastology LVIDs:         2.85 cm  LV e' medial:    9.03 cm/s LV PW:         1.10 cm  LV E/e' medial:  7.5 LV IVS:        1.11 cm  LV e' lateral:   13.60 cm/s LVOT diam:     2.20 cm  LV E/e' lateral: 5.0 LV SV:         56 LV SV Index:   27 LVOT Area:     3.80 cm  RIGHT VENTRICLE RV S prime:     11.30 cm/s TAPSE (M-mode): 1.9 cm LEFT ATRIUM             Index       RIGHT ATRIUM           Index LA diam:        4.00 cm 1.91 cm/m  RA Area:     24.30 cm LA Vol (A2C):   65.3 ml 31.19 ml/m RA Volume:   83.10 ml  39.69 ml/m LA Vol (A4C):   55.1 ml 26.31 ml/m LA Biplane Vol: 61.2 ml 29.23 ml/m  AORTIC VALVE AV Area (Vmax):    2.53 cm AV Area (Vmean):   2.35 cm AV Area (VTI):     2.35 cm AV Vmax:  128.76 cm/s AV Vmean:          82.938 cm/s AV VTI:            0.239 m AV Peak Grad:       6.6 mmHg AV Mean Grad:      3.1 mmHg LVOT Vmax:         85.69 cm/s LVOT Vmean:        51.329 cm/s LVOT VTI:          0.148 m LVOT/AV VTI ratio: 0.62  AORTA Ao Root diam: 3.10 cm MITRAL VALVE               TRICUSPID VALVE MV Area (PHT): 2.31 cm    TR Peak grad:   18.0 mmHg MV Decel Time: 329 msec    TR Vmax:        212.00 cm/s MV E velocity: 67.70 cm/s MV A velocity: 41.60 cm/s  SHUNTS MV E/A ratio:  1.63        Systemic VTI:  0.15 m                            Systemic Diam: 2.20 cm Carlyle Dolly MD Electronically signed by Carlyle Dolly MD Signature Date/Time: 10/27/2020/10:13:48 AM    Final      Discharge Exam: Vitals:   10/27/20 0816 10/27/20 1022  BP: 105/80 (!) 88/59  Pulse: 61 64  Resp: 16 18  Temp: 98.5 F (36.9 C) 98 F (36.7 C)  SpO2: 96% 96%   Vitals:   10/27/20 0415 10/27/20 0810 10/27/20 0816 10/27/20 1022  BP: (!) 102/59 105/80 105/80 (!) 88/59  Pulse: (!) 59 66 61 64  Resp: 17  16 18   Temp: 97.6 F (36.4 C)  98.5 F (36.9 C) 98 F (36.7 C)  TempSrc: Oral  Oral Oral  SpO2: 95%  96% 96%  Weight:      Height:        General: Pt is alert, awake, not in acute distress Cardiovascular: RRR, S1/S2 +, no rubs, no gallops Respiratory: CTA bilaterally, no wheezing, no rhonchi Abdominal: Soft, NT, ND, bowel sounds + Extremities: no edema, no cyanosis    The results of significant diagnostics from this hospitalization (including imaging, microbiology, ancillary and laboratory) are listed below for reference.     Microbiology: Recent Results (from the past 240 hour(s))  Resp Panel by RT-PCR (Flu A&B, Covid) Nasopharyngeal Swab     Status: None   Collection Time: 10/26/20  7:52 PM   Specimen: Nasopharyngeal Swab; Nasopharyngeal(NP) swabs in vial transport medium  Result Value Ref Range Status   SARS Coronavirus 2 by RT PCR NEGATIVE NEGATIVE Final    Comment: (NOTE) SARS-CoV-2 target nucleic acids are NOT DETECTED.  The SARS-CoV-2 RNA is generally detectable in  upper respiratory specimens during the acute phase of infection. The lowest concentration of SARS-CoV-2 viral copies this assay can detect is 138 copies/mL. A negative result does not preclude SARS-Cov-2 infection and should not be used as the sole basis for treatment or other patient management decisions. A negative result may occur with  improper specimen collection/handling, submission of specimen other than nasopharyngeal swab, presence of viral mutation(s) within the areas targeted by this assay, and inadequate number of viral copies(<138 copies/mL). A negative result must be combined with clinical observations, patient history, and epidemiological information. The expected result is Negative.  Fact Sheet for Patients:  EntrepreneurPulse.com.au  Fact Sheet for Healthcare  Providers:  IncredibleEmployment.be  This test is no t yet approved or cleared by the [place] and  has been authorized for detection and/or diagnosis of SARS-CoV-2 by FDA under an Emergency Use Authorization (EUA). This EUA will remain  in effect (meaning this test can be used) for the duration of the COVID-19 declaration under Section 564(b)(1) of the Act, 21 U.S.C.section 360bbb-3(b)(1), unless the authorization is terminated  or revoked sooner.       Influenza A by PCR NEGATIVE NEGATIVE Final   Influenza B by PCR NEGATIVE NEGATIVE Final    Comment: (NOTE) The Xpert Xpress SARS-CoV-2/FLU/RSV plus assay is intended as an aid in the diagnosis of influenza from Nasopharyngeal swab specimens and should not be used as a sole basis for treatment. Nasal washings and aspirates are unacceptable for Xpert Xpress SARS-CoV-2/FLU/RSV testing.  Fact Sheet for Patients: EntrepreneurPulse.com.au  Fact Sheet for Healthcare Providers: IncredibleEmployment.be  This test is not yet approved or cleared by the [place] FDA and has been  authorized for detection and/or diagnosis of SARS-CoV-2 by FDA under an Emergency Use Authorization (EUA). This EUA will remain in effect (meaning this test can be used) for the duration of the COVID-19 declaration under Section 564(b)(1) of the Act, 21 U.S.C. section 360bbb-3(b)(1), unless the authorization is terminated or revoked.  Performed at [facility], [address] [place], [place] [address]      Labs: BNP (last 3 results) No results for input(s): BNP in the last 8760 hours. Basic Metabolic Panel: Recent Labs  Lab 10/26/20 1632  NA 137  K 3.6  CL 102  CO2 25  GLUCOSE 109*  BUN 16  CREATININE 0.97  CALCIUM 9.3   Liver Function Tests: Recent Labs  Lab 10/26/20 1632  AST 16  ALT 24  ALKPHOS 72  BILITOT 0.9  PROT 7.7  ALBUMIN 4.3   No results for input(s): LIPASE, AMYLASE in the last 168 hours. No results for input(s): AMMONIA in the last 168 hours. CBC: Recent Labs  Lab 10/26/20 1632  WBC 12.8*  HGB 15.7  HCT 45.9  MCV 90.7  PLT 178   Cardiac Enzymes: No results for input(s): CKTOTAL, CKMB, CKMBINDEX, TROPONINI in the last 168 hours. BNP: Invalid input(s): POCBNP CBG: Recent Labs  Lab 10/26/20 1705  GLUCAP 125*   D-Dimer No results for input(s): DDIMER in the last 72 hours. Hgb A1c No results for input(s): HGBA1C in the last 72 hours. Lipid Profile No results for input(s): CHOL, HDL, LDLCALC, TRIG, CHOLHDL, LDLDIRECT in the last 72 hours. Thyroid function studies No results for input(s): TSH, T4TOTAL, T3FREE, THYROIDAB in the last 72 hours.  Invalid input(s): FREET3 Anemia work up No results for input(s): VITAMINB12, FOLATE, FERRITIN, TIBC, IRON, RETICCTPCT in the last 72 hours. Urinalysis    Component Value Date/Time   COLORURINE YELLOW 02/11/2011 0831   APPEARANCEUR HAZY (A) 02/11/2011 0831   LABSPEC >1.030 (H) 02/11/2011 0831   PHURINE 5.5 02/11/2011 0831   GLUCOSEU NEGATIVE 02/11/2011 0831   HGBUR LARGE (A) 02/11/2011 0831    BILIRUBINUR NEGATIVE 02/11/2011 0831   KETONESUR NEGATIVE 02/11/2011 0831   PROTEINUR 30 (A) 02/11/2011 0831   UROBILINOGEN 0.2 02/11/2011 0831   NITRITE NEGATIVE 02/11/2011 0831   LEUKOCYTESUR NEGATIVE 02/11/2011 0831   Sepsis Labs Invalid input(s): PROCALCITONIN,  WBC,  LACTICIDVEN Microbiology Recent Results (from the past 240 hour(s))  Resp Panel by RT-PCR (Flu A&B, Covid) Nasopharyngeal Swab     Status: None   Collection Time: 10/26/20  7:52 PM   Specimen: Nasopharyngeal Swab; Nasopharyngeal(NP) swabs in vial transport medium  Result Value Ref Range Status   SARS Coronavirus 2 by RT PCR NEGATIVE NEGATIVE Final    Comment: (NOTE) SARS-CoV-2 target nucleic acids are NOT DETECTED.  The SARS-CoV-2 RNA is generally detectable in upper respiratory specimens during the acute phase of infection. The lowest concentration of SARS-CoV-2 viral copies this assay can detect is 138 copies/mL. A negative result does not preclude SARS-Cov-2 infection and should not be used as the sole basis for treatment or other patient management decisions. A negative result may occur with  improper specimen collection/handling, submission of specimen other than nasopharyngeal swab, presence of viral mutation(s) within the areas targeted by this assay, and inadequate number of viral copies(<138 copies/mL). A negative result must be combined with clinical observations, patient history, and epidemiological information. The expected result is Negative.  Fact Sheet for Patients:  EntrepreneurPulse.com.au  Fact Sheet for Healthcare Providers:  IncredibleEmployment.be  This test is no t yet approved or cleared by the [place] FDA and  has been authorized for detection and/or diagnosis of SARS-CoV-2 by FDA under an Emergency Use Authorization (EUA). This EUA will remain  in effect (meaning this test can be used) for the duration of the COVID-19 declaration under  Section 564(b)(1) of the Act, 21 U.S.C.section 360bbb-3(b)(1), unless the authorization is terminated  or revoked sooner.       Influenza A by PCR NEGATIVE NEGATIVE Final   Influenza B by PCR NEGATIVE NEGATIVE Final    Comment: (NOTE) The Xpert Xpress SARS-CoV-2/FLU/RSV plus assay is intended as an aid in the diagnosis of influenza from Nasopharyngeal swab specimens and should not be used as a sole basis for treatment. Nasal washings and aspirates are unacceptable for Xpert Xpress SARS-CoV-2/FLU/RSV testing.  Fact Sheet for Patients: EntrepreneurPulse.com.au  Fact Sheet for Healthcare Providers: IncredibleEmployment.be  This test is not yet approved or cleared by the [place] FDA and has been authorized for detection and/or diagnosis of SARS-CoV-2 by FDA under an Emergency Use Authorization (EUA). This EUA will remain in effect (meaning this test can be used) for the duration of the COVID-19 declaration under Section 564(b)(1) of the Act, 21 U.S.C. section 360bbb-3(b)(1), unless the authorization is terminated or revoked.  Performed at [facility], [address] [place], [place] [address]      Time coordinating discharge: 35 minutes  SIGNED:   Rodena Goldmann, DO [facility] 10/27/2020, 10:29 AM  If 7PM-7AM, please contact night-coverage [email or web address]

## 2020-10-27 NOTE — Progress Notes (Incomplete)
Patient seen and disucssed with PA Ahmed Prima, I agree with her documentation. 50 yo male history of CAD with prior ramus stent, HTN, hyperlipidemia presented with chest pain.  Aching pain midhcest worst with laying flat, better with sitting up. Took ibuprofen at home with some improvement.    K 3.6 Cr 0.97 BUN 16 WBC 12.8 Hgb 15.7 Plt 178 ESR 14 CRP 11.2 Trop 3-->3-->4-->4 COVID neg EKG SR, diffuse ST elevations, avR PR elevation  CXR no acute process CT PE: no PE, cardiogemegaly, CAD.    05/2020 nuclear stress: no ischemia   Patient presents with symptoms suggestive of pericarditis along with typical EKG findings and elevated CRP. Echo is pending. Started on iburprofen 618m tid and would continue x 7 days. Has received colchicine 1.241mx 2 doses, change to 0.58m62mid x 3 months.

## 2020-10-27 NOTE — Progress Notes (Signed)
Patient ambulated in the hall, reports no complaints of pain/discomfort. Reports no dizziness while ambulating.

## 2020-10-27 NOTE — Consult Note (Addendum)
Cardiology Consultation:   Patient ID: Rodney Baldwin MRN: [id number]; DOB: 07/18/1970  Admit date: 10/26/2020 Date of Consult: 10/27/2020  PCP:  Mann, Benjamin L, Barrett  Cardiologist:  Rozann Lesches, MD [phone]    Patient Profile:   Rodney Baldwin is a 50 y.o. male with PMH of CAD (s/p NSTEMI in 03/2018 with DES to RI and otherwise mild to moderate nonobstructive CAD, low-risk NST in 05/2020), HTN, HLD and tobacco use who is being seen today for the evaluation of chest pain at the request of Dr. Denton Brick.  History of Present Illness:   [name] was examined by Dr. Domenic Polite in 04/2020 and denied any recent chest pain or dyspnea at that time.  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 This was performed in 05/2020 and showed no significant ischemia or scar, overall being a low-risk study.  He presented to [facility] ED on 10/26/2020 for evaluation of chest pain which started the night prior. In talking with the patient today, he reports being in his usual state of health until the night of admission when he developed an aching sensation along his left pectoral region. Pain was worse with leaning forward or lying down in bed but says he had little energy as well and felt like he could not take a deep breath when walking around the house or climbing steps. Therefore, he did not go to work and tried to rest in his recliner but felt better sitting up. Says he has experienced allergies but no recent cold or sick contacts. Says his current pain feels different from his prior MI. He denies any recent orthopnea, PND or lower extremity edema.   Initial labs show WBC 12.8, Hgb 15.7, platelets 178, Na+ 137, K+ 3.6 and creatinine 0.97.  Initial and cyclic troponin values have been negative at 3, 3, 4 and 4. COVID negative. Sed rate normal at 14 but CRP  elevated to 11.2. CXR with linear bibasilar scarring or atelectasis but no active disease. CTA showed no evidence of a PE but was noted to have cardiomegaly and scattered coronary calcifications. EKG shows NSR, HR 81 with slight diffuse ST elevation along the inferior and lateral leads.   The hospitalist did review with Cardiology on-call last night and his EKG was felt to be most consistent with likely pericarditis and was recommended to start Ibuprofen and Colchicine. The patient reports his pain has already started to improve this morning.    Past Medical History:  Diagnosis Date  . CAD (coronary artery disease)    Occluded ramus intermedius status post DES September 2019  . GERD (gastroesophageal reflux disease)   . Hypertension   . Laryngitis   . NSTEMI (non-ST elevated myocardial infarction) [facility])    September 2019  . Strep throat   . Tonsillitis     Past Surgical History:  Procedure Laterality Date  . CORONARY ANGIOPLASTY WITH STENT PLACEMENT    . WISDOM TOOTH EXTRACTION       Home Medications:  Prior to Admission medications   Medication Sig Start Date End Date Taking? Authorizing Provider  aspirin 81 MG chewable tablet Chew 162 mg by mouth daily. 04/05/18  Yes [provider]  atorvastatin (LIPITOR) 80 MG tablet Take 80 mg by mouth daily.  04/28/18  Yes [provider]  esomeprazole (NEXIUM) 20 MG capsule Take 20 mg  by mouth daily at 12 noon.   Yes [provider]  ibuprofen (ADVIL) 800 MG tablet Take 1 tablet (800 mg total) by mouth every 8 (eight) hours as needed for moderate pain. 05/20/20  Yes Long, Wonda Olds, MD  losartan (COZAAR) 25 MG tablet Take 0.5 tablets (12.5 mg total) by mouth daily. 05/04/20  Yes Satira Sark, MD  metoprolol tartrate (LOPRESSOR) 25 MG tablet Take 12.5 mg by mouth 2 (two) times daily.  04/05/18  Yes [provider]  nitroGLYCERIN (NITROSTAT) 0.4 MG SL tablet DISSOLVE 1 TABLET UNDER THE TONGUE EVERY 5  MINUTES X 3 DOSES AS NEEDED FOR CHEST PAIN (IF NO RELIEF AFTER 2nd DOSE, PROCEED TO THE ER FOR AN EVALUATION OR CALL 911) Patient taking differently: Place 0.4 mg under the tongue every 5 (five) minutes as needed for chest pain. 07/05/20  Yes Satira Sark, MD  naproxen (NAPROSYN) 500 MG tablet Take 1 tablet (500 mg total) by mouth 2 (two) times daily. Patient not taking: No sig reported 08/06/20   Milton Ferguson, MD  oxyCODONE-acetaminophen (PERCOCET/ROXICET) 5-325 MG tablet Take 1 tablet by mouth every 6 (six) hours as needed for severe pain. Patient not taking: No sig reported 05/20/20   Long, Wonda Olds, MD  senna-docusate (SENOKOT-S) 8.6-50 MG tablet Take 1 tablet by mouth at bedtime as needed for mild constipation or moderate constipation. Patient not taking: No sig reported 05/20/20   Long, Wonda Olds, MD    Inpatient Medications: Scheduled Meds: . aspirin  81 mg Oral Daily  . atorvastatin  80 mg Oral Daily  . colchicine  0.6 mg Oral BID  . enoxaparin (LOVENOX) injection  40 mg Subcutaneous Q24H  . ibuprofen  600 mg Oral TID  . metoprolol tartrate  12.5 mg Oral BID   Continuous Infusions:  PRN Meds: acetaminophen **OR** acetaminophen, ondansetron **OR** ondansetron (ZOFRAN) IV, polyethylene glycol  Allergies:   No Known Allergies  Social History:   Social History   Socioeconomic History  . Marital status: Married    Spouse name: Not on file  . Number of children: Not on file  . Years of education: Not on file  . Highest education level: Not on file  Occupational History  . Not on file  Tobacco Use  . Smoking status: Current Every Day Smoker    Packs/day: 1.00    Types: Cigarettes  . Smokeless tobacco: Never Used  Vaping Use  . Vaping Use: Former  . Devices: 2 mg cartridge  Substance and Sexual Activity  . Alcohol use: No  . Drug use: No  . Sexual activity: Not on file  Other Topics Concern  . Not on file  Social History Narrative  . Not on file   Social  Determinants of Health   Financial Resource Strain: Not on file  Food Insecurity: Not on file  Transportation Needs: Not on file  Physical Activity: Not on file  Stress: Not on file  Social Connections: Not on file  Intimate Partner Violence: Not on file    Family History:    Family History  Problem Relation Age of Onset  . CAD Mother   . Diabetes Mother   . CAD Father      ROS:  Please see the history of present illness.   All other ROS reviewed and negative.     Physical Exam/Data:   Vitals:   10/27/20 0205 10/27/20 0415 10/27/20 0810 10/27/20 0816  BP: 105/62 (!) 102/59 105/80 105/80  Pulse: 71 (!)  59 66 61  Resp: _0 Temp: 98.3 F (36.8 C) 97.6 F (36.4 C)  98.5 F (36.9 C)  TempSrc: Oral Oral  Oral  SpO2: 99% 95%  96%  Weight:      Height:       No intake or output data in the 24 hours ending 10/27/20 0834 Last 3 Weights 10/26/2020 08/06/2020 05/20/2020  Weight (lbs) 192 lb 192 lb 182 lb 15.7 oz  Weight (kg) 87.091 kg 87.091 kg 83 kg     Body mass index is 26.04 kg/m.  General:  Well nourished, well developed male appearing in no acute distress. HEENT: normal Lymph: no adenopathy Neck: no JVD Endocrine:  No thryomegaly Vascular: No carotid bruits; FA pulses 2+ bilaterally without bruits  Cardiac:  RRR; no murmur. Muffled heart sounds.  Lungs:  clear to auscultation bilaterally, no wheezing, rhonchi or rales  Abd: soft, nontender, no hepatomegaly  Ext: no edema Musculoskeletal:  No deformities, BUE and BLE strength normal and equal Skin: warm and dry  Neuro:  CNs 2-12 intact, no focal abnormalities noted Psych:  Normal affect   EKG:  The EKG was personally reviewed and demonstrates: NSR, HR 81 with slight diffuse ST elevation along the inferior and lateral leads.   Telemetry:  Telemetry was personally reviewed and demonstrates: NSR, HR in 60's to 70's.   Relevant CV Studies:  Cardiac Catheterization: 03/2018 Cardiac Cath / PCI 04/04/2018,  Dr. Ammie Ferrier Hemodynamics LVEDP 25 LV 125//25 Ao 125/78  LVEF 60%  Cors: Right dominant LM ok LAD 40% irreg, 60% small Diag - plan MedRx RI moderate 100% prox-culprit LCx nondom 20-30% irregs, NSD RCA large dom 20-40% irregs, NSD  PCI of RI 100% occlusion: 6 Fr EBU 3.5, Runthrough, heparin, Integrilin  Pre dilated 2x8 Stent ONYX 2.5x18 DES Post 2.5x12 [place] to rated  0% residual TIMI III flow  NST: 05/2020  Lexiscan stress is electrically negative for ischemia.  Myovue scan with normal perfusion with minimal apical thinning No significant ischemia or scar.  LVEF 56%  Low risk scan     Laboratory Data:  High Sensitivity Troponin:   Recent Labs  Lab 10/26/20 1632 10/26/20 1840 10/26/20 2045 10/27/20 0606  TROPONINIHS _1 Chemistry Recent Labs  Lab 10/26/20 1632  NA 137  K 3.6  CL 102  CO2 25  GLUCOSE 109*  BUN 16  CREATININE 0.97  CALCIUM 9.3  GFRNONAA >60  ANIONGAP 10    Recent Labs  Lab 10/26/20 1632  PROT 7.7  ALBUMIN 4.3  AST 16  ALT 24  ALKPHOS 72  BILITOT 0.9   Hematology Recent Labs  Lab 10/26/20 1632  WBC 12.8*  RBC 5.06  HGB 15.7  HCT 45.9  MCV 90.7  MCH 31.0  MCHC 34.2  RDW 13.2  PLT 178   BNPNo results for input(s): BNP, PROBNP in the last 168 hours.  DDimer No results for input(s): DDIMER in the last 168 hours.   Radiology/Studies:  CT Angio Chest PE W and/or Wo Contrast  Result Date: 10/26/2020 CLINICAL DATA:  Pleuritic chest pain EXAM: CT ANGIOGRAPHY CHEST WITH CONTRAST TECHNIQUE: Multidetector CT imaging of the chest was performed using the standard protocol during bolus administration of intravenous contrast. Multiplanar CT image reconstructions and MIPs were obtained to evaluate the vascular anatomy. CONTRAST:  17m OMNIPAQUE IOHEXOL 350 MG/ML SOLN COMPARISON:  None. FINDINGS: Cardiovascular: No filling defects in the pulmonary arteries to suggest pulmonary emboli. Scattered  coronary artery and aortic  calcifications. Heart is mildly enlarged. No aneurysm. Mediastinum/Nodes: No mediastinal, hilar, or axillary adenopathy. Trachea and esophagus are unremarkable. Thyroid unremarkable. Lungs/Pleura: Bibasilar atelectasis.  No effusions. Upper Abdomen: Imaging into the upper abdomen demonstrates no acute findings. Musculoskeletal: Chest wall soft tissues are unremarkable. No acute bony abnormality. Review of the MIP images confirms the above findings. IMPRESSION: No evidence of pulmonary embolus. Cardiomegaly, scattered coronary artery disease. Bibasilar atelectasis. Aortic Atherosclerosis (ICD10-I70.0). Electronically Signed   By: Rolm Baptise M.D.   On: 10/26/2020 19:15   DG Chest Portable 1 View  Result Date: 10/26/2020 CLINICAL DATA:  Chest pain EXAM: PORTABLE CHEST 1 VIEW COMPARISON:  01/06/2008 FINDINGS: Linear increased markings in the lung bases could reflect atelectasis or scarring. Heart is upper limits normal in size. No effusions or confluent opacities. No acute bony abnormality. IMPRESSION: Linear bibasilar scarring or atelectasis.  No active disease. Electronically Signed   By: Rolm Baptise M.D.   On: 10/26/2020 17:10     Assessment and Plan:   1. Chest Pain most consistent with Acute Pericarditis - Presented with chest pain which is worse with positional changes and with taking a deep breath. Does not resemble his prior angina.  - Hs Troponin values have been negative and Sed rate was normal at 14 but CRP elevated to 11.2. CTA showed no evidence of a PE. EKG shows NSR, HR 81 with slight diffuse ST elevation along the inferior and lateral leads.  - His overall presentation seems most consistent with pericarditis. Echocardiogram pending to assess LV function and to assess for a pericardial effusion.  - He was started on Colchicine 1.75m BID but this can likely be reduced to 0.660mBID and would anticipate a 3-51-monthurse. Also on Ibuprofen 600m24mD and would continue for 7-14 days. Will  review with Dr. BranHarl Bowieregards to adding a short-course of PPI therapy while on high-dose NSAIDS.   2. CAD - He is s/p NSTEMI in 03/2018 with DES to RI and otherwise mild to moderate nonobstructive CAD with recent ischemic evaluation being a low-risk NST in 05/2020. - His Hs Troponin values have been negative this admission. Echocardiogram pending.  - Continue ASA 81mg85mly, Atorvastatin 80mg 86my and Lopressor 12.5mg BI20m  3. HTN - Continue Lopressor 12.5mg BID29mosartan was recently reduced to 12.5mg dail18mfter he lost 30 lbs with dietary changes. Will hold Losartan for now (BP at 105/80 on most recent check).   4. HLD - He remains on Atorvastatin 80mg dail19mth goal LDL less than 70 with known CAD.   5. Tobacco Use - He has reduced his use to less than 0.25 ppd. Congratulated on his reduction with cessation advised.    For questions or updates, please contact [facility] [email or web address] for contact info under    Signed, Brittany MErma Heritage21/2022 8:34 AM  Patient seen and disucssed with PA Strader, IAhmed Primawith her documentation. 50 yo male51istory of CAD with prior ramus stent, HTN, hyperlipidemia presented with chest pain.  Aching pain midhcest worst with laying flat, better with sitting up. Took ibuprofen at home with some improvement.    K 3.6 Cr 0.97 BUN 16 WBC 12.8 Hgb 15.7 Plt 178 ESR 14 CRP 11.2 Trop 3-->3-->4-->4 COVID neg EKG SR, diffuse ST elevations, avR PR elevation  CXR no acute process CT PE: no PE, cardiogemegaly, CAD.    05/2020 nuclear stress: no ischemia   Patient presents with symptoms  suggestive of pericarditis along with typical EKG findings and elevated CRP. Echo is pending. Started on iburprofen 655m tid and would continue x 7 days. Has received colchicine 1.210mx 2 doses, change to 0.48m9mid x 3 months.   F/u echo later today, pending symptos later in the day possible discharge.   JonCarlyle Dolly

## 2020-10-27 NOTE — Progress Notes (Signed)
*  PRELIMINARY RESULTS* Echocardiogram 2D Echocardiogram has been performed.  Leavy Cella 10/27/2020, 10:05 AM

## 2020-11-18 ENCOUNTER — Ambulatory Visit: Payer: 59 | Admitting: Student

## 2020-11-18 ENCOUNTER — Encounter: Payer: Self-pay | Admitting: Student

## 2020-11-18 ENCOUNTER — Other Ambulatory Visit: Payer: Self-pay

## 2020-11-18 VITALS — BP 116/66 | HR 68 | Ht 72.0 in | Wt 193.8 lb

## 2020-11-18 DIAGNOSIS — E785 Hyperlipidemia, unspecified: Secondary | ICD-10-CM | POA: Diagnosis not present

## 2020-11-18 DIAGNOSIS — I2510 Atherosclerotic heart disease of native coronary artery without angina pectoris: Secondary | ICD-10-CM | POA: Diagnosis not present

## 2020-11-18 DIAGNOSIS — I309 Acute pericarditis, unspecified: Secondary | ICD-10-CM

## 2020-11-18 DIAGNOSIS — I10 Essential (primary) hypertension: Secondary | ICD-10-CM | POA: Diagnosis not present

## 2020-11-18 MED ORDER — ASPIRIN EC 81 MG PO TBEC: 81.0000 mg | DELAYED_RELEASE_TABLET | Freq: Every day | ORAL | 11 refills | Status: AC

## 2020-11-18 NOTE — Progress Notes (Signed)
Cardiology Office Note    Date:  11/18/2020   ID:  Lorna Dibble, DOB 1971/02/15, MRN [id number]  PCP:  Cory Munch, PA-C  Cardiologist: Rozann Lesches, MD    Chief Complaint  Patient presents with  . Hospitalization Follow-up    History of Present Illness:    Rodney Baldwin is a 50 y.o. male with past medical history of CAD (s/p NSTEMI in 03/2018 with DES to RI and otherwise mild to moderate nonobstructive CAD, low-risk NST in 05/2020), HTN, HLD and tobacco use who presents to the office today for hospital follow-up.  He most recently presented to [facility] ED on 10/26/2020 for evaluation of chest pain which had started the night of admission and was worse with leaning forward or lying down in bed. Reported feeling like he could not take a deep breath. Troponin values were negative and CTA was negative for a PE but he was noted to have an elevated CRP to 11.2. EKG also showed diffuse ST elevation along the inferior and lateral leads. Findings were felt to be most consistent with pericarditis and he was started on Ibuprofen 600 mg TID for 7 days along with Colchicine 0.6 mg BID for 3 months.  His echocardiogram showed a preserved EF of 55 to 60% with no regional wall motion abnormalities and no evidence of pericardial effusion.  He was discharged home and informed to follow-up with Cardiology as an outpatient.  In talking with the patient today, he reports feeling "great" since his recent hospitalization. Reports his pain completely resolved within 1 to 2 days after hospital discharge. He completed his course of Motrin and remains on Colchicine at this time. He denies any recent chest pain or dyspnea on exertion. No recent orthopnea, PND or lower extremity edema. He does experience occasional palpitations but episodes last for a few seconds then resolve. No recent symptoms.   He does continue to smoke but has reduced his use to less than a pack per day.  Past Medical History:   Diagnosis Date  . CAD (coronary artery disease)    Occluded ramus intermedius status post DES September 2019  . GERD (gastroesophageal reflux disease)   . Hypertension   . Laryngitis   . NSTEMI (non-ST elevated myocardial infarction) [facility])    September 2019  . Strep throat   . Tonsillitis     Past Surgical History:  Procedure Laterality Date  . CORONARY ANGIOPLASTY WITH STENT PLACEMENT    . WISDOM TOOTH EXTRACTION      Current Medications: Outpatient Medications Prior to Visit  Medication Sig Dispense Refill  . atorvastatin (LIPITOR) 80 MG tablet Take 80 mg by mouth daily.     . colchicine 0.6 MG tablet Take 1 tablet (0.6 mg total) by mouth 2 (two) times daily. 60 tablet 2  . esomeprazole (NEXIUM) 20 MG capsule Take 20 mg by mouth daily at 12 noon.    . metoprolol tartrate (LOPRESSOR) 25 MG tablet Take 12.5 mg by mouth 2 (two) times daily.     . nitroGLYCERIN (NITROSTAT) 0.4 MG SL tablet DISSOLVE 1 TABLET UNDER THE TONGUE EVERY 5 MINUTES X 3 DOSES AS NEEDED FOR CHEST PAIN (IF NO RELIEF AFTER 2nd DOSE, PROCEED TO THE ER FOR AN EVALUATION OR CALL 911) (Patient taking differently: Place 0.4 mg under the tongue every 5 (five) minutes as needed for chest pain.) 100 tablet 3  . aspirin 81 MG chewable tablet Chew 162 mg by mouth daily.    Marland Kitchen  senna-docusate (SENOKOT-S) 8.6-50 MG tablet Take 1 tablet by mouth at bedtime as needed for mild constipation or moderate constipation. 30 tablet 0  . oxyCODONE-acetaminophen (PERCOCET/ROXICET) 5-325 MG tablet Take 1 tablet by mouth every 6 (six) hours as needed for severe pain. 6 tablet 0   No facility-administered medications prior to visit.     Allergies:   Patient has no known allergies.   Social History   Socioeconomic History  . Marital status: Married    Spouse name: Not on file  . Number of children: Not on file  . Years of education: Not on file  . Highest education level: Not on file  Occupational History  . Not on file  Tobacco  Use  . Smoking status: Current Every Day Smoker    Packs/day: 1.00    Types: Cigarettes  . Smokeless tobacco: Never Used  Vaping Use  . Vaping Use: Former  . Devices: 2 mg cartridge  Substance and Sexual Activity  . Alcohol use: No  . Drug use: No  . Sexual activity: Not on file  Other Topics Concern  . Not on file  Social History Narrative  . Not on file   Social Determinants of Health   Financial Resource Strain: Not on file  Food Insecurity: Not on file  Transportation Needs: Not on file  Physical Activity: Not on file  Stress: Not on file  Social Connections: Not on file     Family History:  The patient's family history includes CAD in his father and mother; Diabetes in his mother.   Review of Systems:    Please see the history of present illness.     All other systems reviewed and are otherwise negative except as noted above.   Physical Exam:    VS:  BP 116/66   Pulse 68   Ht 6' (1.829 m)   Wt 193 lb 12.8 oz (87.9 kg)   SpO2 96%   BMI 26.28 kg/m    General: Well developed, well nourished,male appearing in no acute distress. Head: Normocephalic, atraumatic. Neck: No carotid bruits. JVD not elevated.  Lungs: Respirations regular and unlabored, without wheezes or rales.  Heart: Regular rate and rhythm. No S3 or S4.  No murmur, no rubs, or gallops appreciated. Abdomen: Appears non-distended. No obvious abdominal masses. Msk:  Strength and tone appear normal for age. No obvious joint deformities or effusions. Extremities: No clubbing or cyanosis. No lower extremity edema.  Distal pedal pulses are 2+ bilaterally. Neuro: Alert and oriented X 3. Moves all extremities spontaneously. No focal deficits noted. Psych:  Responds to questions appropriately with a normal affect. Skin: No rashes or lesions noted  Wt Readings from Last 3 Encounters:  11/18/20 193 lb 12.8 oz (87.9 kg)  10/26/20 192 lb (87.1 kg)  08/06/20 192 lb (87.1 kg)     Studies/Labs Reviewed:    EKG:  EKG is not ordered today.    Recent Labs: 10/26/2020: ALT 24; BUN 16; Creatinine, Ser 0.97; Hemoglobin 15.7; Platelets 178; Potassium 3.6; Sodium 137   Lipid Panel    Component Value Date/Time   CHOL 138 05/29/2018 0937   TRIG 247 (H) 05/29/2018 0937   HDL 27 (L) 05/29/2018 0937   LDLCALC 62 05/29/2018 0937    Additional studies/ records that were reviewed today include:   NST: 05/2020  Lexiscan stress is electrically negative for ischemia.  Myovue scan with normal perfusion with minimal apical thinning No significant ischemia or scar.  LVEF 56%  Low risk  scan   Echocardiogram: 10/27/2020 IMPRESSIONS    1. Left ventricular ejection fraction, by estimation, is 55 to 60%. The  left ventricle has normal function. The left ventricle has no regional  wall motion abnormalities. There is mild left ventricular hypertrophy.  Left ventricular diastolic parameters  were normal.  2. Right ventricular systolic function is normal. The right ventricular  size is normal.  3. The mitral valve is normal in structure. No evidence of mitral valve  regurgitation. No evidence of mitral stenosis.  4. The aortic valve is tricuspid. Aortic valve regurgitation is not  visualized. No aortic stenosis is present.  5. The inferior vena cava is normal in size with <50% respiratory  variability, suggesting right atrial pressure of 8 mmHg.    Assessment:    1. Acute pericarditis, unspecified type   2. Coronary artery disease involving native coronary artery of native heart without angina pectoris   3. Essential hypertension   4. Hyperlipidemia LDL goal <70      Plan:   In order of problems listed above:  1. Acute Pericarditis - This was a new diagnosis for the patient during his most recent admission and echocardiogram at that time showed no evidence of an effusion. Reports feeling well at this time and denies any recurrent chest pain.   - He has completed his course of  Ibuprofen and I encouraged him to complete a full 3 months of Colchicine 0.6 mg twice daily.  2. CAD - He is s/p NSTEMI in 03/2018 with DES to RI and otherwise mild to moderate nonobstructive CAD with low-risk NST in 05/2020. - He denies any recent anginal symptoms. Continue ASA 81 mg daily, Atorvastatin 80 mg daily and Lopressor 12.5 mg twice daily. He was congratulated on his reduction in tobacco use with cessation advised.  3. HTN - BP is well controlled at 116/66 during today's visit and he reports Losartan was previously discontinued given his well-controlled numbers in the setting of dietary changes. I encouraged him to keep track of this at home. Continue Lopressor 12.5 mg twice daily.  4. HLD - Followed by his PCP.  He remains on Atorvastatin 80 mg daily with goal LDL less than 70 in the setting of known CAD.   Medication Adjustments/Labs and Tests Ordered: Current medicines are reviewed at length with the patient today.  Concerns regarding medicines are outlined above.  Medication changes, Labs and Tests ordered today are listed in the Patient Instructions below. Patient Instructions  Medication Instructions:  Your physician has recommended you make the following change in your medication:  You may stop Colchinicine 0.6 mg tablets in THREE MONTHS  *If you need a refill on your cardiac medications before your next appointment, please call your pharmacy*   Lab Work: None If you have labs (blood work) drawn today and your tests are completely normal, you will receive your results only by: Marland Kitchen MyChart Message (if you have MyChart) OR . A paper copy in the mail If you have any lab test that is abnormal or we need to change your treatment, we will call you to review the results.   Testing/Procedures: None   Follow-Up: At [facility], you and your health needs are our priority.  As part of our continuing mission to provide you with exceptional heart care, we have created  designated Provider Care Teams.  These Care Teams include your primary Cardiologist (physician) and Advanced Practice Providers (APPs -  Physician Assistants and Nurse Practitioners) who all work together  to provide you with the care you need, when you need it.  We recommend signing up for the patient portal called "MyChart".  Sign up information is provided on this After Visit Summary.  MyChart is used to connect with patients for Virtual Visits (Telemedicine).  Patients are able to view lab/test results, encounter notes, upcoming appointments, etc.  Non-urgent messages can be sent to your provider as well.   To learn more about what you can do with MyChart, go to [email or web address].    Your next appointment:   6 month(s)  The format for your next appointment:   In Person  Provider:   Rozann Lesches, MD   Other Instructions       Signed, Erma Heritage, PA-C  11/18/2020 4:47 PM    [facility] [address] [place], [place] [address] Phone: [phone] Fax: [phone]

## 2020-11-18 NOTE — Patient Instructions (Signed)
Medication Instructions:  Your physician has recommended you make the following change in your medication:  You may stop Colchinicine 0.6 mg tablets in THREE MONTHS  *If you need a refill on your cardiac medications before your next appointment, please call your pharmacy*   Lab Work: None If you have labs (blood work) drawn today and your tests are completely normal, you will receive your results only by: Marland Kitchen MyChart Message (if you have MyChart) OR . A paper copy in the mail If you have any lab test that is abnormal or we need to change your treatment, we will call you to review the results.   Testing/Procedures: None   Follow-Up: At [facility], you and your health needs are our priority.  As part of our continuing mission to provide you with exceptional heart care, we have created designated Provider Care Teams.  These Care Teams include your primary Cardiologist (physician) and Advanced Practice Providers (APPs -  Physician Assistants and Nurse Practitioners) who all work together to provide you with the care you need, when you need it.  We recommend signing up for the patient portal called "MyChart".  Sign up information is provided on this After Visit Summary.  MyChart is used to connect with patients for Virtual Visits (Telemedicine).  Patients are able to view lab/test results, encounter notes, upcoming appointments, etc.  Non-urgent messages can be sent to your provider as well.   To learn more about what you can do with MyChart, go to [email or web address].    Your next appointment:   6 month(s)  The format for your next appointment:   In Person  Provider:   Rozann Lesches, MD   Other Instructions

## 2021-04-11 ENCOUNTER — Other Ambulatory Visit: Payer: Self-pay | Admitting: Cardiology

## 2022-03-15 ENCOUNTER — Other Ambulatory Visit: Payer: Self-pay | Admitting: Cardiology

## 2022-04-30 NOTE — Progress Notes (Signed)
Cardiology Office Note    Date:  05/01/2022   ID:  Rodney Baldwin, DOB [birth year], MRN [id number]  PCP:  Cory Munch, PA-C  Cardiologist:  Rozann Lesches, MD  Electrophysiologist:  None   Chief Complaint: f/u CAD  History of Present Illness:   Rodney Baldwin is a 51 y.o. male with history of CAD (NSTEMI 03/2018 s/p DES to RI otherwise mild-moderate nonobstructive CAD managed medically, low risk nuc 05/2020), HTN, HLD, tobacco abuse, pre-DM, and pericarditis 10/2020 (tx with ibuprofen + colchicine) who presents for follow-up. Echo last year showed EF 55-60%, mild LVH, normal RV, no pericardial effusion.  He is seen for follow-up today overall doing well without any recent CP, SOB, palpitations, edema, or syncope. He notes during the times he was following his BP in the past year it was running 119-417 systolic at home.  He has gained weight since he quit smoking. He is now vaping but hopes to be able to quit this as well. 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 Dr. Domenic Polite has traditionally accomplished this with a Lexiscan nuclear stress test.   Labwork independently reviewed: Brought in labs from his work: A1C 6.1 (he is aware of pre-DM), LDL 67, trig 67 10/2020 Hgb 15.7, plt 178, WBC frequently up, K 3.6, Cr 0.97, LFTs ok 2019 LDL 62, trig 247, TSH ok   Cardiology Studies:   Studies reviewed are outlined and summarized above. Reports included below if pertinent.   Echo 10/2020    1. Left ventricular ejection fraction, by estimation, is 55 to 60%. The  left ventricle has normal function. The left ventricle has no regional  wall motion abnormalities. There is mild left ventricular hypertrophy.  Left ventricular diastolic parameters  were normal.   2. Right ventricular systolic function is normal. The right ventricular  size is normal.   3. The mitral valve is normal in structure. No evidence of mitral valve  regurgitation. No evidence of mitral  stenosis.   4. The aortic valve is tricuspid. Aortic valve regurgitation is not  visualized. No aortic stenosis is present.   5. The inferior vena cava is normal in size with <50% respiratory  variability, suggesting right atrial pressure of 8 mmHg.   Nuc 05/2020 Lexiscan stress is electrically negative for ischemia. Myovue scan with normal perfusion with minimal apical thinning No significant ischemia or scar. LVEF 56% Low risk scan    Past Medical History:  Diagnosis Date   CAD (coronary artery disease)    Occluded ramus intermedius status post DES September 2019   GERD (gastroesophageal reflux disease)    Hypertension    Laryngitis    NSTEMI (non-ST elevated myocardial infarction) [facility])    September 2019   Strep throat    Tonsillitis     Past Surgical History:  Procedure Laterality Date   CORONARY ANGIOPLASTY WITH STENT PLACEMENT     WISDOM TOOTH EXTRACTION      Current Medications: Current Meds  Medication Sig   aspirin EC 81 MG tablet Take 1 tablet (81 mg total) by mouth daily. Swallow whole.   atorvastatin (LIPITOR) 80 MG tablet Take 80 mg by mouth daily.    esomeprazole (NEXIUM) 20 MG capsule Take 20 mg by mouth daily at 12 noon.   losartan (COZAAR) 25 MG tablet TAKE ONE-HALF TABLET BY  MOUTH DAILY (Patient taking differently: Take 25 mg by mouth daily.)   metoprolol tartrate (LOPRESSOR) 25 MG tablet Take 12.5 mg by mouth  2 (two) times daily.    nitroGLYCERIN (NITROSTAT) 0.4 MG SL tablet DISSOLVE 1 TABLET UNDER THE TONGUE EVERY 5 MINUTES X 3 DOSES AS NEEDED FOR CHEST PAIN (IF NO RELIEF AFTER 2nd DOSE, PROCEED TO THE ER FOR AN EVALUATION OR CALL 911) (Patient taking differently: Place 0.4 mg under the tongue every 5 (five) minutes as needed for chest pain.)    Allergies:   Patient has no known allergies.   Social History   Socioeconomic History   Marital status: Married    Spouse name: Not on file   Number of children: Not on file   Years of education: Not on  file   Highest education level: Not on file  Occupational History   Not on file  Tobacco Use   Smoking status: Former    Packs/day: 1.00    Types: Cigarettes    Quit date: 03/05/2022    Years since quitting: 0.1   Smokeless tobacco: Never  Vaping Use   Vaping Use: Former   Devices: 2 mg cartridge  Substance and Sexual Activity   Alcohol use: No   Drug use: No   Sexual activity: Not on file  Other Topics Concern   Not on file  Social History Narrative   Not on file   Social Determinants of Health   Financial Resource Strain: Not on file  Food Insecurity: Not on file  Transportation Needs: Not on file  Physical Activity: Not on file  Stress: Not on file  Social Connections: Not on file     Family History:  The patient's family history includes CAD in his father and mother; Diabetes in his mother.  ROS:   Please see the history of present illness.  All other systems are reviewed and otherwise negative.    EKG(s)/Additional Labs   EKG:  EKG is ordered today, personally reviewed, demonstrating NSR 74bpm, no acute STT changes. Prior STE has improved.  Recent Labs: No results found for requested labs within last 365 days.  Recent Lipid Panel    Component Value Date/Time   CHOL 138 05/29/2018 0937   TRIG 247 (H) 05/29/2018 0937   HDL 27 (L) 05/29/2018 0937   LDLCALC 62 05/29/2018 0937    PHYSICAL EXAM:    VS:  BP (!) 142/94   Pulse 79   Ht 6' (1.829 m)   Wt 220 lb 12.8 oz (100.2 kg)   SpO2 95%   BMI 29.95 kg/m   BMI: Body mass index is 29.95 kg/m.  GEN: Well nourished, well developed male in no acute distress HEENT: normocephalic, atraumatic Neck: no JVD, carotid bruits, or masses Cardiac: RRR; no murmurs, rubs, or gallops, no edema  Respiratory:  clear to auscultation bilaterally, normal work of breathing GI: soft, nontender, nondistended, + BS MS: no deformity or atrophy Skin: warm and dry, no rash Neuro:  Alert and Oriented x 3, Strength and  sensation are intact, follows commands Psych: euthymic mood, full affect  Wt Readings from Last 3 Encounters:  05/01/22 220 lb 12.8 oz (100.2 kg)  11/18/20 193 lb 12.8 oz (87.9 kg)  10/26/20 192 lb (87.1 kg)     ASSESSMENT & PLAN:   1. CAD - doing well without any accelerating angina. LDL at goal by labs 02/2022, they were otherwise abbreviated so we will get updated CMET/CBC today. Continue ASA, atorvastatin '80mg'$  daily, and metoprolol 12.'5mg'$  BID. He is in need of biennial stress test again this year. With his elevated BP, exercise nuc may be challenging  therefore agree with pursuit of Lexiscan again this year. He is agreeable.  2. Essential HTN - suboptimally controlled today. 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 Update CMET, TSH today. Weight gain may be contributing. Patient plans lifestyle modification but in the meantime, increasing losartan seems most logical step. A bump of an additional 12.'5mg'$  seems unlikely to bring to goal. We will titrate losartan to '50mg'$  daily with recheck BMET 1 week. Thinking ahead, if he needs additional rx, he would be a candidate for further increase in losartan/change to irbesartan or addition of amlodipine contingent on K/Cr.  We also need a better idea of what it's running at home after the change. The patient was provided instructions on monitoring blood pressure at home for 1 week and relaying results to our office. We'll otherwise plan to schedule f/u in 3-4 weeks to ensure tolerating this change in prep for his DOT physical.   HYPERTENSION CONTROL Vitals:   05/01/22 1447 05/01/22 1521  BP: (!) 142/94 (!) 158/84    The patient's blood pressure is elevated above target today.  In order to address the patient's elevated BP: A current anti-hypertensive medication was adjusted today.     3. Hyperlipidemia - LDL controlled by labs 02/2022. Obtain f/u CMET  and continue atorvastatin.  4. H/o pericarditis - resolved. He confirms he is no longer on colchicine. We'll remove from his medicine list.     Disposition: F/u with me in 3-4 weeks, likely [place] if pt agreeable.   Medication Adjustments/Labs and Tests Ordered: Current medicines are reviewed at length with the patient today.  Concerns regarding medicines are outlined above. Medication changes, Labs and Tests ordered today are summarized above and listed in the Patient Instructions accessible in Encounters.    Signed, Charlie Pitter, PA-C  05/01/2022 3:03 PM    [facility] Location in [place]. [place], [place] [address] Ph: [phone]; Fax [phone]

## 2022-05-01 ENCOUNTER — Encounter: Payer: Self-pay | Admitting: *Deleted

## 2022-05-01 ENCOUNTER — Ambulatory Visit: Payer: 59 | Attending: Physician Assistant | Admitting: Physician Assistant

## 2022-05-01 ENCOUNTER — Encounter: Payer: Self-pay | Admitting: Physician Assistant

## 2022-05-01 VITALS — BP 158/84 | HR 79 | Ht 72.0 in | Wt 220.8 lb

## 2022-05-01 DIAGNOSIS — I2510 Atherosclerotic heart disease of native coronary artery without angina pectoris: Secondary | ICD-10-CM

## 2022-05-01 DIAGNOSIS — Z8679 Personal history of other diseases of the circulatory system: Secondary | ICD-10-CM

## 2022-05-01 DIAGNOSIS — I10 Essential (primary) hypertension: Secondary | ICD-10-CM | POA: Diagnosis not present

## 2022-05-01 DIAGNOSIS — E785 Hyperlipidemia, unspecified: Secondary | ICD-10-CM

## 2022-05-01 MED ORDER — LOSARTAN POTASSIUM 50 MG PO TABS: 50.0000 mg | ORAL_TABLET | Freq: Every day | ORAL | 3 refills | Status: DC

## 2022-05-01 NOTE — Patient Instructions (Addendum)
We need to get a better idea of what your blood pressure is running at home. Here are some instructions to follow: - I would recommend using a blood pressure cuff that goes on your arm. The wrist ones can be inaccurate. If you're purchasing one for the first time, try to select one that also reports your heart rate because this can be helpful information as well. - To check your blood pressure, choose a time at least 3 hours after taking your blood pressure medicines. If you can sample it at different times of the day, that's great - it might give you more information about how your blood pressure fluctuates. Remain seated in a chair for 5 minutes quietly beforehand, then check it.  - Please record a list of those readings and call us/send in MyChart message with them for our review in 1 week.  Medication Instructions:  Your physician has recommended you make the following change in your medication:   Increase Losartan to 50 mg Daily   *If you need a refill on your cardiac medications before your next appointment, please call your pharmacy*   Lab Work: Your physician recommends that you return for lab work in: Today   Your physician recommends that you return for lab work in: 1 Week ( BMET)   If you have labs (blood work) drawn today and your tests are completely normal, you will receive your results only by: MyChart Message (if you have MyChart) OR A paper copy in the mail If you have any lab test that is abnormal or we need to change your treatment, we will call you to review the results.   Testing/Procedures: Your physician has requested that you have a lexiscan myoview. For further information please visit [email or web address]. Please follow instruction sheet, as given.    Follow-Up: At [facility], you and your health needs are our priority.  As part of our continuing mission to provide you with exceptional heart care, we have created designated Provider Care Teams.  These  Care Teams include your primary Cardiologist (physician) and Advanced Practice Providers (APPs -  Physician Assistants and Nurse Practitioners) who all work together to provide you with the care you need, when you need it.  We recommend signing up for the patient portal called "MyChart".  Sign up information is provided on this After Visit Summary.  MyChart is used to connect with patients for Virtual Visits (Telemedicine).  Patients are able to view lab/test results, encounter notes, upcoming appointments, etc.  Non-urgent messages can be sent to your provider as well.   To learn more about what you can do with MyChart, go to [email or web address].    Your next appointment:   3-4 week(s)  The format for your next appointment:   In Person  Provider:   Melina Copa, PA-C       Other Instructions Thank you for choosing [facility]!    Important Information About Sugar

## 2022-05-02 ENCOUNTER — Telehealth: Payer: Self-pay

## 2022-05-02 ENCOUNTER — Other Ambulatory Visit (HOSPITAL_COMMUNITY)
Admission: RE | Admit: 2022-05-02 | Discharge: 2022-05-02 | Disposition: A | Discharge status: Home / Self Care | Payer: 59 | Source: Ambulatory Visit | Attending: Physician Assistant | Admitting: Physician Assistant

## 2022-05-02 DIAGNOSIS — I2510 Atherosclerotic heart disease of native coronary artery without angina pectoris: Secondary | ICD-10-CM | POA: Insufficient documentation

## 2022-05-02 DIAGNOSIS — E785 Hyperlipidemia, unspecified: Secondary | ICD-10-CM | POA: Insufficient documentation

## 2022-05-02 DIAGNOSIS — I10 Essential (primary) hypertension: Secondary | ICD-10-CM | POA: Insufficient documentation

## 2022-05-02 LAB — COMPREHENSIVE METABOLIC PANEL
ALT: 27 U/L (ref 0–44)
AST: 19 U/L (ref 15–41)
Albumin: 4.3 g/dL (ref 3.5–5.0)
Alkaline Phosphatase: 109 U/L (ref 38–126)
Anion gap: 8 (ref 5–15)
BUN: 14 mg/dL (ref 6–20)
CO2: 25 mmol/L (ref 22–32)
Calcium: 9.3 mg/dL (ref 8.9–10.3)
Chloride: 103 mmol/L (ref 98–111)
Creatinine, Ser: 0.83 mg/dL (ref 0.61–1.24)
GFR, Estimated: >60 mL/min (ref >60)
Glucose, Bld: 101 mg/dL — ABNORMAL HIGH (ref 70–99)
Potassium: 4 mmol/L (ref 3.5–5.1)
Sodium: 136 mmol/L (ref 135–145)
Total Bilirubin: 0.8 mg/dL (ref 0.3–1.2)
Total Protein: 7.9 g/dL (ref 6.5–8.1)

## 2022-05-02 LAB — CBC
HCT: 41.2 % (ref 39.0–52.0)
Hemoglobin: 14.4 g/dL (ref 13.0–17.0)
MCH: 30.3 pg (ref 26.0–34.0)
MCHC: 35 g/dL (ref 30.0–36.0)
MCV: 86.6 fL (ref 80.0–100.0)
Platelets: 188 10*3/uL (ref 150–400)
RBC: 4.76 MIL/uL (ref 4.22–5.81)
RDW: 12.8 % (ref 11.5–15.5)
WBC: 7.6 10*3/uL (ref 4.0–10.5)
nRBC: 0 % (ref 0.0–0.2)

## 2022-05-02 LAB — TSH: TSH: 1.958 u[IU]/mL (ref 0.350–4.500)

## 2022-05-02 NOTE — Telephone Encounter (Signed)
Patient notified and verbalized understanding. Patient had no questions or concerns at this time.  

## 2022-05-02 NOTE — Telephone Encounter (Signed)
-----   Message from Charlie Pitter, [place] sent at 05/02/2022 11:53 AM EDT ----- Please let pt know labs stable. Continue plan as discussed. Looks like [facility] kicked out the BMET order that is planned for 1 week. Still needs this in 1 week since we increased losartan. Thanks.

## 2022-05-11 ENCOUNTER — Telehealth: Payer: Self-pay

## 2022-05-11 ENCOUNTER — Other Ambulatory Visit (HOSPITAL_COMMUNITY)
Admission: RE | Admit: 2022-05-11 | Discharge: 2022-05-11 | Disposition: A | Discharge status: Home / Self Care | Payer: 59 | Source: Ambulatory Visit | Attending: Physician Assistant | Admitting: Physician Assistant

## 2022-05-11 DIAGNOSIS — I10 Essential (primary) hypertension: Secondary | ICD-10-CM | POA: Insufficient documentation

## 2022-05-11 DIAGNOSIS — Z79899 Other long term (current) drug therapy: Secondary | ICD-10-CM

## 2022-05-11 LAB — BASIC METABOLIC PANEL
Anion gap: 8 (ref 5–15)
BUN: 15 mg/dL (ref 6–20)
CO2: 24 mmol/L (ref 22–32)
Calcium: 9.1 mg/dL (ref 8.9–10.3)
Chloride: 103 mmol/L (ref 98–111)
Creatinine, Ser: 0.9 mg/dL (ref 0.61–1.24)
GFR, Estimated: >60 mL/min (ref >60)
Glucose, Bld: 91 mg/dL (ref 70–99)
Potassium: 3.7 mmol/L (ref 3.5–5.1)
Sodium: 135 mmol/L (ref 135–145)

## 2022-05-11 MED ORDER — IRBESARTAN 300 MG PO TABS: 300.0000 mg | ORAL_TABLET | Freq: Every day | ORAL | 3 refills | Status: DC

## 2022-05-11 NOTE — Telephone Encounter (Signed)
Given lack of significant change with losartan increase, let's change losartan to irbesartan '300mg'$  daily. This med is similar to losartan but sometimes has better BP lowering effect in people who may not be good responders to losartan. BMET 1 week. Please have him call in with BP readings the day he gets his labs so we can review all of this information at one time. If BP still high at that time, will likely add amlodipine.

## 2022-05-11 NOTE — Telephone Encounter (Signed)
Lab results discussed with patient. He states his BP has remained on "the high side". He said he averaged 144/86 after increase in Losartan to 50 mg qd.   I will inform provider.

## 2022-05-11 NOTE — Telephone Encounter (Signed)
Patient agrees to stop losartan and start irbesartan 300 mg and get bmet in 1 week. He will also call us back with BP readings.

## 2022-05-11 NOTE — Telephone Encounter (Signed)
-----   Message from Charlie Pitter, [place] sent at 05/11/2022  2:10 PM EDT ----- Labs stable. Please have patient relay BP readings on higher dose of losartan

## 2022-05-14 ENCOUNTER — Encounter (HOSPITAL_COMMUNITY)
Admission: RE | Admit: 2022-05-14 | Discharge: 2022-05-14 | Disposition: A | Discharge status: Home / Self Care | Payer: 59 | Source: Ambulatory Visit | Attending: Physician Assistant | Admitting: Physician Assistant

## 2022-05-14 ENCOUNTER — Ambulatory Visit (HOSPITAL_COMMUNITY)
Admission: RE | Admit: 2022-05-14 | Discharge: 2022-05-14 | Disposition: A | Discharge status: Home / Self Care | Payer: 59 | Source: Ambulatory Visit | Attending: Cardiology | Admitting: Cardiology

## 2022-05-14 DIAGNOSIS — I2510 Atherosclerotic heart disease of native coronary artery without angina pectoris: Secondary | ICD-10-CM | POA: Diagnosis present

## 2022-05-14 LAB — NM MYOCAR MULTI W/SPECT W/WALL MOTION / EF
LV dias vol: 154 mL (ref 62–150)
LV sys vol: 70 mL
Nuc Stress EF: 55 %
Peak HR: 77 {beats}/min
RATE: 0
Rest HR: 48 {beats}/min
Rest Nuclear Isotope Dose: 9.4 mCi
SDS: 0
SRS: 1
SSS: 1
ST Depression (mm): 0 mm
Stress Nuclear Isotope Dose: 30 mCi
TID: 1.09

## 2022-05-14 MED ORDER — TECHNETIUM TC 99M TETROFOSMIN IV KIT
10.0000 | PACK | Freq: Once | INTRAVENOUS | Status: AC | PRN
  Administered 2022-05-14: 9.4 via INTRAVENOUS

## 2022-05-14 MED ORDER — TECHNETIUM TC 99M TETROFOSMIN IV KIT
30.0000 | PACK | Freq: Once | INTRAVENOUS | Status: AC | PRN
  Administered 2022-05-14: 30 via INTRAVENOUS

## 2022-05-14 MED ORDER — REGADENOSON 0.4 MG/5ML IV SOLN
INTRAVENOUS | Status: AC
  Administered 2022-05-14: 0.4 mg via INTRAVENOUS
  Filled 2022-05-14: qty 5

## 2022-05-14 MED ORDER — SODIUM CHLORIDE FLUSH 0.9 % IV SOLN
INTRAVENOUS | Status: AC
  Administered 2022-05-14: 10 mL via INTRAVENOUS
  Filled 2022-05-14: qty 10

## 2022-05-22 DIAGNOSIS — Z79899 Other long term (current) drug therapy: Secondary | ICD-10-CM

## 2022-05-22 DIAGNOSIS — I10 Essential (primary) hypertension: Secondary | ICD-10-CM

## 2022-05-22 MED ORDER — LOSARTAN POTASSIUM 50 MG PO TABS: 75.0000 mg | ORAL_TABLET | Freq: Every day | ORAL | 3 refills | Status: DC

## 2022-06-11 NOTE — Progress Notes (Deleted)
Cardiology Office Note    Date:  06/11/2022   ID:  Lorna Dibble, DOB May 19, 1971, MRN [id number]  PCP:  Cory Munch, PA-C  Cardiologist:  Rozann Lesches, MD  Electrophysiologist:  None   Chief Complaint: ***  History of Present Illness:   KEONE KAMER is a 51 y.o. male with history of CAD (NSTEMI 03/2018 s/p DES to RI otherwise mild-moderate nonobstructive CAD managed medically, low risk nuc 05/2020), HTN, HLD, tobacco abuse, pre-DM, aortic atherosclerosis, and pericarditis 10/2020 (tx with ibuprofen + colchicine) who presents for follow-up. Echo 10/2020 showed EF 55-60%, mild LVH, normal RV, no pericardial effusion. At recent OV he was doing well except BP was elevated. 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 We increased his losartan then changed to irbesartan but he did not like the way he felt on irbesartan so went back to losartan but the '75mg'$  dose per message review.   Not taking colchicine can take off   CAD Essential HTN Hyperlipidemia H/o pericarditis  Labwork independently reviewed: 05/11/22 K 3.7, Cr 0.90 04/2022 LFTs ok, CBC, TSH ok 02/2022 Brought in labs from his work: A1C 6.1 (he is aware of pre-DM), LDL 67, trig 67   Cardiology Studies:   Studies reviewed are outlined and summarized above. Reports included below if pertinent.   Nuc 05/2022   No ST segment deviation noted during stress   There are no perfusion defects consistent with prior infarction or current ischemia   Left ventricular ejection farction is normal, 55-60%   The study is normal. The study is low risk.  Echo 10/2020    1. Left ventricular ejection fraction, by estimation, is 55 to 60%. The  left ventricle has normal function. The left ventricle has no regional  wall motion abnormalities. There is mild left ventricular hypertrophy.  Left ventricular diastolic parameters  were normal.   2. Right ventricular systolic  function is normal. The right ventricular  size is normal.   3. The mitral valve is normal in structure. No evidence of mitral valve  regurgitation. No evidence of mitral stenosis.   4. The aortic valve is tricuspid. Aortic valve regurgitation is not  visualized. No aortic stenosis is present.   5. The inferior vena cava is normal in size with <50% respiratory  variability, suggesting right atrial pressure of 8 mmHg.    Nuc 05/2020 Lexiscan stress is electrically negative for ischemia. Myovue scan with normal perfusion with minimal apical thinning No significant ischemia or scar. LVEF 56% Low risk scan    Past Medical History:  Diagnosis Date   CAD (coronary artery disease)    Occluded ramus intermedius status post DES September 2019   GERD (gastroesophageal reflux disease)    Hypertension    Laryngitis    NSTEMI (non-ST elevated myocardial infarction) [facility])    September 2019   Strep throat    Tonsillitis     Past Surgical History:  Procedure Laterality Date   CORONARY ANGIOPLASTY WITH STENT PLACEMENT     WISDOM TOOTH EXTRACTION      Current Medications: No outpatient medications have been marked as taking for the 06/13/22 encounter (Appointment) with Charlie Pitter, PA-C.   ***   Allergies:   Patient has no known allergies.   Social History   Socioeconomic History   Marital status: Married    Spouse name: Not on file   Number of children: Not on file  Years of education: Not on file   Highest education level: Not on file  Occupational History   Not on file  Tobacco Use   Smoking status: Former    Packs/day: 1.00    Types: Cigarettes    Quit date: 03/05/2022    Years since quitting: 0.2   Smokeless tobacco: Never  Vaping Use   Vaping Use: Former   Devices: 2 mg cartridge  Substance and Sexual Activity   Alcohol use: No   Drug use: No   Sexual activity: Not on file  Other Topics Concern   Not on file  Social History Narrative   Not on file   Social  Determinants of Health   Financial Resource Strain: Not on file  Food Insecurity: Not on file  Transportation Needs: Not on file  Physical Activity: Not on file  Stress: Not on file  Social Connections: Not on file     Family History:  The patient's ***family history includes CAD in his father and mother; Diabetes in his mother.  ROS:   Please see the history of present illness. Otherwise, review of systems is positive for ***.  All other systems are reviewed and otherwise negative.    EKG(s)/Additional Labs   EKG:  EKG is ordered today, personally reviewed, demonstrating ***  Recent Labs: 05/02/2022: ALT 27; Hemoglobin 14.4; Platelets 188; TSH 1.958 05/11/2022: BUN 15; Creatinine, Ser 0.90; Potassium 3.7; Sodium 135  Recent Lipid Panel    Component Value Date/Time   CHOL 138 05/29/2018 0937   TRIG 247 (H) 05/29/2018 0937   HDL 27 (L) 05/29/2018 0937   LDLCALC 62 05/29/2018 0937    PHYSICAL EXAM:    VS:  There were no vitals taken for this visit.  BMI: There is no height or weight on file to calculate BMI.  GEN: Well nourished, well developed male in no acute distress HEENT: normocephalic, atraumatic Neck: no JVD, carotid bruits, or masses Cardiac: ***RRR; no murmurs, rubs, or gallops, no edema  Respiratory:  clear to auscultation bilaterally, normal work of breathing GI: soft, nontender, nondistended, + BS MS: no deformity or atrophy Skin: warm and dry, no rash Neuro:  Alert and Oriented x 3, Strength and sensation are intact, follows commands Psych: euthymic mood, full affect  Wt Readings from Last 3 Encounters:  05/01/22 220 lb 12.8 oz (100.2 kg)  11/18/20 193 lb 12.8 oz (87.9 kg)  10/26/20 192 lb (87.1 kg)     ASSESSMENT & PLAN:   ***     Disposition: F/u with ***   Medication Adjustments/Labs and Tests Ordered: Current medicines are reviewed at length with the patient today.  Concerns regarding medicines are outlined above. Medication changes, Labs  and Tests ordered today are summarized above and listed in the Patient Instructions accessible in Encounters.   Signed, Charlie Pitter, PA-C  06/11/2022 5:33 PM    [place] Phone: [phone]; Fax: [phone]

## 2022-06-13 ENCOUNTER — Ambulatory Visit: Payer: 59 | Admitting: Physician Assistant

## 2022-06-13 DIAGNOSIS — I2510 Atherosclerotic heart disease of native coronary artery without angina pectoris: Secondary | ICD-10-CM

## 2022-06-13 DIAGNOSIS — Z8679 Personal history of other diseases of the circulatory system: Secondary | ICD-10-CM

## 2022-06-13 DIAGNOSIS — I10 Essential (primary) hypertension: Secondary | ICD-10-CM

## 2022-06-13 DIAGNOSIS — E785 Hyperlipidemia, unspecified: Secondary | ICD-10-CM

## 2022-07-23 ENCOUNTER — Encounter: Payer: Self-pay | Admitting: Nurse Practitioner

## 2022-07-23 ENCOUNTER — Encounter: Payer: 59 | Attending: Physician Assistant | Admitting: Nurse Practitioner

## 2022-07-23 VITALS — BP 138/90 | HR 62 | Ht 72.0 in | Wt 228.3 lb

## 2022-07-23 DIAGNOSIS — E785 Hyperlipidemia, unspecified: Secondary | ICD-10-CM | POA: Diagnosis not present

## 2022-07-23 DIAGNOSIS — I2510 Atherosclerotic heart disease of native coronary artery without angina pectoris: Secondary | ICD-10-CM | POA: Diagnosis not present

## 2022-07-23 DIAGNOSIS — Z79899 Other long term (current) drug therapy: Secondary | ICD-10-CM | POA: Diagnosis not present

## 2022-07-23 DIAGNOSIS — I10 Essential (primary) hypertension: Secondary | ICD-10-CM | POA: Diagnosis not present

## 2022-07-23 MED ORDER — LOSARTAN POTASSIUM 100 MG PO TABS: 100.0000 mg | ORAL_TABLET | Freq: Every day | ORAL | 3 refills | Status: DC

## 2022-07-23 NOTE — Progress Notes (Signed)
Office Visit    Patient Name: Rodney Baldwin Date of Encounter: 07/23/2022  Primary Care Provider:  Ginger Organ Primary Cardiologist:  Rozann Lesches, MD  Chief Complaint    52 year old male with a history of CAD status post non-STEMI and PCI of the ramus intermedius in September 2019, hypertension, hyperlipidemia, prediabetes, prior tobacco abuse, and pericarditis in April 2022, who presents for follow-up related to hypertension.  Past Medical History    Past Medical History:  Diagnosis Date   CAD (coronary artery disease)    a. 03/2018 NSTEMI/PCI: Occluded RI (DES). Otw mod, nonobs dzs; b. 07/2018 MV: No isch/infarct; c. 05/2020 MV: EF 56%, no isch/infarct; d. 05/2022 MV: EF 55-60%, no isch/infarct.   GERD (gastroesophageal reflux disease)    History of echocardiogram    a. 10/2020 Echo: EF 55-60%, no rwma, mild LVH, nl RV fxn.   Hyperlipidemia LDL goal <70    Hypertension    Laryngitis    NSTEMI (non-ST elevated myocardial infarction) [facility])    September 2019   Strep throat    Tonsillitis    Past Surgical History:  Procedure Laterality Date   CORONARY ANGIOPLASTY WITH STENT PLACEMENT     WISDOM TOOTH EXTRACTION      Allergies  No Known Allergies  History of Present Illness    52 year old male with the above past medical history including CAD, hypertension, hyperlipidemia, prediabetes, tobacco abuse, and pericarditis.  He suffered a non-STEMI in September 2019 and was found to have an occluded ramus intermedius, which was successfully treated with drug-eluting stent.  He otherwise had nonobstructive disease.  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  In April 2022, he presented with chest pain and was diagnosed with pericarditis.  This was successfully treated with ibuprofen and colchicine.  Echocardiogram in April  2022 showed an EF of 55 to 60% without regional wall motion abnormalities, mild LVH, and normal RV function.  Mr. Gatley was last seen in cardiology clinic in October 2023, at which time he was doing well.  He was noted that his blood pressure had been trending up at home, and his losartan was increased to 50 mg daily.  He was maintained on metoprolol 12.5 mg twice daily.  Phone notes indicate that he continue to have elevated blood pressures and he was switched from losartan to irbesartan.  Unfortunately, patient did not notice any significant response from irbesartan he switched back to losartan which was subsequently titrated to 75 mg daily.  Unfortunately, as he was using 25 mg tablets to achieve 75 mg, he was running out of his medicine and so he cut back down to 50 mg daily.  He has several recordings of blood pressures in mid December, showing that he was trending in the 130s over 80s and sometimes 90s.  Unfortunately, he ran out of his losartan this past Saturday and is awaiting a refill through a mail order pharmacy.  His blood pressure is elevated today at 140/90 and then 138/90 on repeat.  Fortunately, he has been feeling well.  He denies chest pain, palpitations, dyspnea, pnd, orthopnea, n, v, dizziness, syncope, edema, weight gain, or early satiety.   Home Medications    Current Outpatient Medications  Medication Sig Dispense Refill   aspirin EC 81 MG tablet Take 1 tablet (81 mg total) by mouth daily. Swallow  whole. 30 tablet 11   atorvastatin (LIPITOR) 80 MG tablet Take 80 mg by mouth daily.      esomeprazole (NEXIUM) 20 MG capsule Take 20 mg by mouth daily at 12 noon.     metoprolol tartrate (LOPRESSOR) 25 MG tablet Take 12.5 mg by mouth 2 (two) times daily.      nitroGLYCERIN (NITROSTAT) 0.4 MG SL tablet DISSOLVE 1 TABLET UNDER THE TONGUE EVERY 5 MINUTES X 3 DOSES AS NEEDED FOR CHEST PAIN (IF NO RELIEF AFTER 2nd DOSE, PROCEED TO THE ER FOR AN EVALUATION OR CALL 911) (Patient taking  differently: Place 0.4 mg under the tongue every 5 (five) minutes as needed for chest pain.) 100 tablet 3   senna-docusate (SENOKOT-S) 8.6-50 MG tablet Take 1 tablet by mouth at bedtime as needed for mild constipation or moderate constipation. 30 tablet 0   colchicine 0.6 MG tablet Take 1 tablet (0.6 mg total) by mouth 2 (two) times daily. (Patient not taking: Reported on 05/01/2022) 60 tablet 2   losartan (COZAAR) 100 MG tablet Take 1 tablet (100 mg total) by mouth daily. 90 tablet 3   No current facility-administered medications for this visit.     Review of Systems    He denies chest pain, palpitations, dyspnea, pnd, orthopnea, n, v, dizziness, syncope, edema, weight gain, or early satiety.  All other systems reviewed and are otherwise negative except as noted above.    Physical Exam    VS:  BP (!) 140/90   Pulse 62   Ht 6' (1.829 m)   Wt 228 lb 4.8 oz (103.6 kg)   SpO2 96%   BMI 30.96 kg/m  , BMI Body mass index is 30.96 kg/m.     Vitals:   07/23/22 1520 07/23/22 1746  BP: (!) 140/90 (!) 138/90  Pulse: 62   SpO2: 96%     GEN: Well nourished, well developed, in no acute distress. HEENT: normal. Neck: Supple, no JVD, carotid bruits, or masses. Cardiac: RRR, no murmurs, rubs, or gallops. No clubbing, cyanosis, edema.  Radials 2+/PT 2+ and equal bilaterally.  Respiratory:  Respirations regular and unlabored, clear to auscultation bilaterally. GI: Soft, nontender, nondistended, BS + x 4. MS: no deformity or atrophy. Skin: warm and dry, no rash. Neuro:  Strength and sensation are intact. Psych: Normal affect.  Accessory Clinical Findings    Lab Results  Component Value Date   WBC 7.6 05/02/2022   HGB 14.4 05/02/2022   HCT 41.2 05/02/2022   MCV 86.6 05/02/2022   PLT 188 05/02/2022   Lab Results  Component Value Date   CREATININE 0.90 05/11/2022   BUN 15 05/11/2022   NA 135 05/11/2022   K 3.7 05/11/2022   CL 103 05/11/2022   CO2 24 05/11/2022   Lab Results   Component Value Date   ALT 27 05/02/2022   AST 19 05/02/2022   ALKPHOS 109 05/02/2022   BILITOT 0.8 05/02/2022   Lab Results  Component Value Date   CHOL 138 05/29/2018   HDL 27 (L) 05/29/2018   LDLCALC 62 05/29/2018   TRIG 247 (H) 05/29/2018     Assessment & Plan    1.  Coronary artery disease: Status post prior non-STEMI and ramus intermedius stenting in September 2019.  Recent low risk Myoview in November 2023.  He denies chest pain or dyspnea.  He remains on aspirin, statin, beta-blocker, and ARB therapy.  2.  Essential hypertension: Blood pressure variable at home.  He is concerned about ongoing diastolic  hypertension.  Recent trends reviewed.  Increasing losartan to 100 mg daily with plan to follow-up basic metabolic panel in a week.  Continue low-dose metoprolol 12.5 mg twice daily.  We discussed that if he continues to trend up at home, we would switch his metoprolol to carvedilol.  3.  Hyperlipidemia: This is followed by his primary care provider with our notes indicating that LDL was well-controlled by labs in August 2023.  Normal LFTs in October.  4.  History of pericarditis: This occurred in 2022 and was treated with NSAIDs and colchicine.  No recent chest pain.  5.  Disposition: Follow-up basic metabolic panel in a week.  Patient to contact us via MyChart within the next 2 weeks with blood pressure recordings.  Plan to follow-up in clinic in 2 to 3 months.  Murray Hodgkins, NP 07/23/2022, 5:42 PM

## 2022-07-23 NOTE — Patient Instructions (Signed)
Medication Instructions:   Increase Losartan to 100 mg Daily   *If you need a refill on your cardiac medications before your next appointment, please call your pharmacy*   Lab Work: Your physician recommends that you return for lab work in: 1-2 Weeks ( BMET)   If you have labs (blood work) drawn today and your tests are completely normal, you will receive your results only by: [employer] (if you have MyChart) OR A paper copy in the mail If you have any lab test that is abnormal or we need to change your treatment, we will call you to review the results.   Testing/Procedures: NONE    Follow-Up: At [facility], you and your health needs are our priority.  As part of our continuing mission to provide you with exceptional heart care, we have created designated Provider Care Teams.  These Care Teams include your primary Cardiologist (physician) and Advanced Practice Providers (APPs -  Physician Assistants and Nurse Practitioners) who all work together to provide you with the care you need, when you need it.  We recommend signing up for the patient portal called "MyChart".  Sign up information is provided on this After Visit Summary.  MyChart is used to connect with patients for Virtual Visits (Telemedicine).  Patients are able to view lab/test results, encounter notes, upcoming appointments, etc.  Non-urgent messages can be sent to your provider as well.   To learn more about what you can do with MyChart, go to [email or web address].    Your next appointment:   3 month(s)  Provider:   You may see Rozann Lesches, MD or one of the following Advanced Practice Providers on your designated Care Team:   Bernerd Pho, PA-C  Ermalinda Barrios, PA-C     Other Instructions Thank you for choosing [facility]!

## 2022-10-31 ENCOUNTER — Ambulatory Visit: Payer: 59 | Admitting: Cardiology

## 2022-10-31 NOTE — Progress Notes (Deleted)
    Cardiology Office Note  Date: 10/31/2022   ID: Rodney Baldwin, DOB [birth year], MRN [id number]  History of Present Illness: Rodney Baldwin is a 52 y.o. male last seen in January by Mr. Brion Aliment NP, I reviewed the note.   Physical Exam: VS:  There were no vitals taken for this visit., BMI There is no height or weight on file to calculate BMI.  Wt Readings from Last 3 Encounters:  07/23/22 228 lb 4.8 oz (103.6 kg)  05/01/22 220 lb 12.8 oz (100.2 kg)  11/18/20 193 lb 12.8 oz (87.9 kg)    General: Patient appears comfortable at rest. HEENT: Conjunctiva and lids normal, oropharynx clear with moist mucosa. Neck: Supple, no elevated JVP or carotid bruits, no thyromegaly. Lungs: Clear to auscultation, nonlabored breathing at rest. Cardiac: Regular rate and rhythm, no S3 or significant systolic murmur, no pericardial rub. Abdomen: Soft, nontender, no hepatomegaly, bowel sounds present, no guarding or rebound. Extremities: No pitting edema, distal pulses 2+. Skin: Warm and dry. Musculoskeletal: No kyphosis. Neuropsychiatric: Alert and oriented x3, affect grossly appropriate.  ECG:  An ECG dated 05/01/2022 was personally reviewed today and demonstrated:  Sinus rhythm.  Labwork: August 2023: Cholesterol 109, triglycerides 67, HDL 28, LDL 67 05/02/2022: ALT 27; AST 19; Hemoglobin 14.4; Platelets 188; TSH 1.958 05/11/2022: BUN 15; Creatinine, Ser 0.90; Potassium 3.7; Sodium 135   Other Studies Reviewed Today:  Lexiscan Myoview 05/14/2022:   No ST segment deviation noted during stress   There are no perfusion defects consistent with prior infarction or current ischemia   Left ventricular ejection farction is normal, 55-60%   The study is normal. The study is low risk.  Assessment and Plan:  1.  CAD status post DES to the ramus intermedius in 2019 with otherwise moderate residual disease managed medically.  Follow-up Lexiscan Myoview in November 2023 showed no evidence of ischemia with  normal LVEF.  2.  Essential hypertension.  3.  Mixed hyperlipidemia, LDL 67 in August 2023.  Disposition:  Follow up {follow up:15908}  Signed, Jonelle Sidle, M.D., F.A.C.C. [facility] at [facility]

## 2023-07-31 ENCOUNTER — Other Ambulatory Visit: Payer: Self-pay | Admitting: Nurse Practitioner

## 2024-02-25 LAB — BASIC METABOLIC PANEL WITH GFR: Glucose: 119

## 2024-02-25 LAB — LIPID PANEL
Cholesterol: 133 (ref 0–200)
HDL: 25 — AB (ref 35–70)
LDL Cholesterol: 82

## 2024-02-25 LAB — HEMOGLOBIN A1C: Hemoglobin A1C: 6.1

## 2024-03-16 ENCOUNTER — Ambulatory Visit: Attending: Cardiology | Admitting: Cardiology

## 2024-03-16 ENCOUNTER — Encounter: Payer: Self-pay | Admitting: Cardiology

## 2024-03-16 VITALS — BP 128/72 | HR 66 | Ht 72.0 in | Wt 222.2 lb

## 2024-03-16 DIAGNOSIS — I25119 Atherosclerotic heart disease of native coronary artery with unspecified angina pectoris: Secondary | ICD-10-CM | POA: Diagnosis not present

## 2024-03-16 DIAGNOSIS — E782 Mixed hyperlipidemia: Secondary | ICD-10-CM | POA: Diagnosis not present

## 2024-03-16 DIAGNOSIS — I2510 Atherosclerotic heart disease of native coronary artery without angina pectoris: Secondary | ICD-10-CM

## 2024-03-16 DIAGNOSIS — I25118 Atherosclerotic heart disease of native coronary artery with other forms of angina pectoris: Secondary | ICD-10-CM

## 2024-03-16 DIAGNOSIS — I10 Essential (primary) hypertension: Secondary | ICD-10-CM

## 2024-03-16 MED ORDER — ROSUVASTATIN CALCIUM 10 MG PO TABS: 10.0000 mg | ORAL_TABLET | Freq: Every day | ORAL | 3 refills | Status: DC

## 2024-03-16 MED ORDER — METOPROLOL SUCCINATE ER 25 MG PO TB24: 25.0000 mg | ORAL_TABLET | Freq: Every day | ORAL | 3 refills | Status: DC

## 2024-03-16 NOTE — Progress Notes (Signed)
    Cardiology Office Note  Date: 03/16/2024   ID: Rodney Baldwin, DOB 06-04-1971, MRN [id number]  History of Present Illness: Rodney Baldwin is a 53 y.o. male last seen in January 2024 by Mr. Vivienne NP, I reviewed his note.  I have not seen him in the office since 2021.  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  He continues to work as a [employer].  He does not report any angina or nitroglycerin  use.  No unusual shortness of breath with activity, no palpitations or syncope.  Medications reviewed.  He states that he has had some leg cramps and fatigue on Lipitor, better when he has held the medication.  Also mentions that he has been taking Lopressor  only once daily.  We discussed his most recent lipid panel.  Last stress test was in 2023.  I reviewed his ECG today which shows normal sinus rhythm.  Physical Exam: VS:  BP 128/72 (BP Location: Right Arm, Cuff Size: Large)   Pulse 66   Ht 6' (1.829 m)   Wt 222 lb 3.2 oz (100.8 kg)   SpO2 95%   BMI 30.14 kg/m , BMI Body mass index is 30.14 kg/m.  Wt Readings from Last 3 Encounters:  03/16/24 222 lb 3.2 oz (100.8 kg)  07/23/22 228 lb 4.8 oz (103.6 kg)  05/01/22 220 lb 12.8 oz (100.2 kg)    General: Patient appears comfortable at rest. HEENT: Conjunctiva and lids normal. Neck: Supple, no elevated JVP or carotid bruits. Lungs: Clear to auscultation, nonlabored breathing at rest. Cardiac: Regular rate and rhythm, no S3 or significant systolic murmur. Extremities: No pitting edema.  ECG:  An ECG dated 05/01/2022 was personally reviewed today and demonstrated:  Sinus rhythm.  Labwork:  August 2024: Cholesterol 120, triglycerides 126, HDL 27, LDL 70 August 2025: Cholesterol 133, triglycerides 146, HDL 25, LDL 82  Other Studies Reviewed Today:  No interval cardiac testing for review today.  Assessment and Plan:  1.  CAD status post DES to the ramus intermedius in 2019 in the setting of  NSTEMI.  Follow-up Myoview  in 2023 was low risk with no evidence of ischemia and LVEF 55 to 60%.  He reports no angina or interval nitroglycerin  use, due for DOT physical soon.  We will plan to obtain a Lexiscan  Myoview  for ischemic surveillance.  Continue aspirin  81 mg daily.  Plan to switch from Lipitor to Crestor  as discussed below.  He has as needed nitroglycerin  available.  2.  Primary hypertension.  Continue to track at home.  Continue Cozaar  100 mg daily, switch from Lopressor  to Toprol -XL 25 mg daily.  3.  Mixed hyperlipidemia.  LDL 82 in August.  Reports some intolerance to Lipitor as described above.  We will switch to Crestor  beginning at 10 mg daily and uptitrate as tolerated.  4.  History of pericarditis in 2022.  No obvious recurrences.  Disposition:  Follow up 1 year.  Signed, Jayson JUDITHANN Sierras, M.D., F.A.C.C. [facility] at [facility]

## 2024-03-16 NOTE — Patient Instructions (Signed)
 Medication Instructions:   STOP Atorvastatin   In 1 week 03/24/24, START Crestor  10 mg daily   STOP Lopressor   START Toprol  XL 25 mg daily  Labwork: None today  Testing/Procedures: Your physician has requested that you have a lexiscan  myoview . For further information please visit [email or web address] Please follow instruction sheet, as given.   Follow-Up: 1 year  Any Other Special Instructions Will Be Listed Below (If Applicable).  If you need a refill on your cardiac medications before your next appointment, please call your pharmacy.

## 2024-03-17 ENCOUNTER — Ambulatory Visit: Admitting: Internal Medicine

## 2024-03-25 ENCOUNTER — Encounter: Payer: Self-pay | Admitting: Internal Medicine

## 2024-03-25 ENCOUNTER — Ambulatory Visit: Admitting: Internal Medicine

## 2024-03-25 VITALS — BP 138/84 | HR 61 | Ht 72.0 in | Wt 224.0 lb

## 2024-03-25 DIAGNOSIS — L858 Other specified epidermal thickening: Secondary | ICD-10-CM | POA: Insufficient documentation

## 2024-03-25 DIAGNOSIS — Z1159 Encounter for screening for other viral diseases: Secondary | ICD-10-CM

## 2024-03-25 DIAGNOSIS — Z1211 Encounter for screening for malignant neoplasm of colon: Secondary | ICD-10-CM | POA: Diagnosis not present

## 2024-03-25 DIAGNOSIS — R7303 Prediabetes: Secondary | ICD-10-CM | POA: Insufficient documentation

## 2024-03-25 DIAGNOSIS — I25119 Atherosclerotic heart disease of native coronary artery with unspecified angina pectoris: Secondary | ICD-10-CM

## 2024-03-25 DIAGNOSIS — Z125 Encounter for screening for malignant neoplasm of prostate: Secondary | ICD-10-CM | POA: Insufficient documentation

## 2024-03-25 DIAGNOSIS — I10 Essential (primary) hypertension: Secondary | ICD-10-CM

## 2024-03-25 DIAGNOSIS — E782 Mixed hyperlipidemia: Secondary | ICD-10-CM | POA: Insufficient documentation

## 2024-03-25 DIAGNOSIS — H9313 Tinnitus, bilateral: Secondary | ICD-10-CM | POA: Insufficient documentation

## 2024-03-25 DIAGNOSIS — E559 Vitamin D deficiency, unspecified: Secondary | ICD-10-CM

## 2024-03-25 MED ORDER — TIRZEPATIDE 2.5 MG/0.5ML ~~LOC~~ SOAJ: 2.5000 mg | SUBCUTANEOUS | 0 refills | Status: DC

## 2024-03-25 NOTE — Assessment & Plan Note (Signed)
 BP Readings from Last 1 Encounters:  03/25/24 138/84   Well-controlled with Losartan  100 mg QD and Metoprolol  25 mg QD Counseled for compliance with the medications Advised DASH diet and moderate exercise/walking, at least 150 mins/week

## 2024-03-25 NOTE — Assessment & Plan Note (Signed)
 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

## 2024-03-25 NOTE — Assessment & Plan Note (Addendum)
 Lab Results  Component Value Date   HGBA1C 6.1 02/25/2024   Advised to follow low-carb diet Started Wegovy , considering history of CAD, obesity and family history of type II DM

## 2024-03-25 NOTE — Assessment & Plan Note (Signed)
 Bilateral hearing difficulty and tinnitus, could be due to presbycusis Referred to ENT specialist

## 2024-03-25 NOTE — Progress Notes (Addendum)
 New Patient Office Visit  Subjective:  Patient ID: Rodney Baldwin, male    DOB: 1971/05/28  Age: 53 y.o. MRN: [id number]  CC:  Chief Complaint  Patient presents with   Establish Care    New patient establishing care.    Prediabetes    Would like to be started on mounjaro .    HPI Rodney Baldwin is a 53 y.o. male with past medical history of HTN, CAD s/p stent placement, prediabetes and HLD who presents for establishing care.  HTN and CAD: BP is well-controlled. Takes losartan  100 mg QD and metoprolol  25 mg QD regularly. Patient denies headache, dizziness, chest pain, dyspnea or palpitations.  He had stent placement in 2019 for NSTEMI.  He is followed by cardiology currently.  He takes aspirin  81 mg QD and Crestor  10 mg QD.  Prediabetes: Recent HbA1c was 6.1 in 08/25. He has been trying to follow low-carb diet.  He has significant family history of type II DM.  He prefers to start Mounjaro  considering his history of CAD.  Nose mass: He has a chronic mass on the left nostril, which has been growing in size.  He has had dermatology evaluation many years ago, but states that it has grown significantly since then.  He has mild discomfort in the area.  He has scratched the area, which causes bleeding at times.  Tinnitus: He reports bilateral tinnitus, which is chronic.  He has had ENT specialist and hearing evaluation in the past, but was not offered any treatment at that time.  He prefers to get ENT evaluation again.  Denies any ear discharge or pain currently.     Past Medical History:  Diagnosis Date   CAD (coronary artery disease)    a. 03/2018 NSTEMI/PCI: Occluded RI (DES). Otw mod, nonobs dzs; b. 07/2018 MV: No isch/infarct; c. 05/2020 MV: EF 56%, no isch/infarct; d. 05/2022 MV: EF 55-60%, no isch/infarct.   GERD (gastroesophageal reflux disease)    History of echocardiogram    a. 10/2020 Echo: EF 55-60%, no rwma, mild LVH, nl RV fxn.   Hyperlipidemia LDL goal <70    Hypertension     Laryngitis    NSTEMI (non-ST elevated myocardial infarction) [facility])    September 2019   Strep throat    Tonsillitis     Past Surgical History:  Procedure Laterality Date   CORONARY ANGIOPLASTY WITH STENT PLACEMENT     WISDOM TOOTH EXTRACTION      Family History  Problem Relation Age of Onset   CAD Mother    Diabetes Mother    CAD Father     Social History   Socioeconomic History   Marital status: Married    Spouse name: Not on file   Number of children: Not on file   Years of education: Not on file   Highest education level: Not on file  Occupational History   Not on file  Tobacco Use   Smoking status: Former    Current packs/day: 0.00    Types: Cigarettes    Quit date: 03/05/2022    Years since quitting: 2.0   Smokeless tobacco: Never  Vaping Use   Vaping status: Every Day   Substances: Nicotine   Devices: 2 mg cartridge  Substance and Sexual Activity   Alcohol use: No   Drug use: No   Sexual activity: Not on file  Other Topics Concern   Not on file  Social History Narrative   Not on file   Social  Drivers of Corporate investment banker Strain: Not on file  Food Insecurity: Not on file  Transportation Needs: Not on file  Physical Activity: Not on file  Stress: Not on file  Social Connections: Unknown (12/20/2022)   Received from [facility]   Social Network    Social Network: Not on file  Intimate Partner Violence: Unknown (12/20/2022)   Received from [facility]   HITS    Physically Hurt: Not on file    Insult or Talk Down To: Not on file    Threaten Physical Harm: Not on file    Scream or Curse: Not on file    ROS Review of Systems  Constitutional:  Negative for chills and fever.  HENT:  Positive for tinnitus. Negative for congestion and sore throat.        Hearing difficulty  Eyes:  Negative for pain and discharge.  Respiratory:  Negative for cough and shortness of breath.   Cardiovascular:  Negative for chest pain and palpitations.   Gastrointestinal:  Negative for diarrhea, nausea and vomiting.  Endocrine: Negative for polydipsia and polyuria.  Genitourinary:  Negative for dysuria and hematuria.  Musculoskeletal:  Negative for neck pain and neck stiffness.  Skin:  Negative for rash.  Neurological:  Negative for dizziness, weakness, numbness and headaches.  Psychiatric/Behavioral:  Negative for agitation and behavioral problems.     Objective:   Today's Vitals: BP 138/84 (BP Location: Right Arm)   Pulse 61   Ht 6' (1.829 m)   Wt 224 lb (101.6 kg)   SpO2 97%   BMI 30.38 kg/m   Physical Exam Vitals reviewed.  Constitutional:      General: He is not in acute distress.    Appearance: He is obese. He is not diaphoretic.  HENT:     Head: Normocephalic and atraumatic.     Nose: Nose normal.     Mouth/Throat:     Mouth: Mucous membranes are moist.  Eyes:     General: No scleral icterus.    Extraocular Movements: Extraocular movements intact.  Cardiovascular:     Rate and Rhythm: Normal rate and regular rhythm.     Heart sounds: Normal heart sounds. No murmur heard. Pulmonary:     Breath sounds: Normal breath sounds. No wheezing or rales.  Abdominal:     Palpations: Abdomen is soft.     Tenderness: There is no abdominal tenderness.  Musculoskeletal:     Cervical back: Neck supple. No tenderness.     Right lower leg: No edema.     Left lower leg: No edema.  Skin:    General: Skin is warm.     Findings: No rash.     Comments: Verrucous mass over left nostril  Neurological:     General: No focal deficit present.     Mental Status: He is alert and oriented to person, place, and time.     Cranial Nerves: No cranial nerve deficit.     Sensory: No sensory deficit.     Motor: No weakness.  Psychiatric:        Mood and Affect: Mood normal.        Behavior: Behavior normal.     Assessment & Plan:   Problem List Items Addressed This Visit       Cardiovascular and Mediastinum   CAD (coronary artery  disease)   S/p DES in 2019 Followed by Cardiology On Aspirin  and statin On Metoprolol  Started Wegovy  for CAD and prediabetes Denies any  recent chest pain episode      Relevant Medications   semaglutide -weight management (WEGOVY ) 0.25 MG/0.5ML SOAJ SQ injection   Other Relevant Orders   TSH (Completed)   CMP14+EGFR (Completed)   CBC with Differential/Platelet (Completed)   HTN (hypertension) - Primary   BP Readings from Last 1 Encounters:  03/25/24 138/84   Well-controlled with Losartan  100 mg QD and Metoprolol  25 mg QD Counseled for compliance with the medications Advised DASH diet and moderate exercise/walking, at least 150 mins/week      Relevant Orders   CMP14+EGFR (Completed)   CBC with Differential/Platelet (Completed)     Musculoskeletal and Integument   Keratoacanthoma of nose   Mass on nose is concerning for keratoacanthoma Referred to dermatology in [place], as per patient request      Relevant Orders   Ambulatory referral to Dermatology     Other   Mixed hyperlipidemia   Recent lipid profile reviewed On Crestor  10 mg QD currently, was recently switched from atorvastatin       Colon cancer screening   Discussed about colonoscopy and cologuard - benefits of each procedure discussed. Patient prefers Cologuard - ordered.      Relevant Orders   Cologuard   Tinnitus of both ears   Bilateral hearing difficulty and tinnitus, could be due to presbycusis Referred to ENT specialist      Relevant Orders   Ambulatory referral to ENT   Prediabetes   Lab Results  Component Value Date   HGBA1C 6.1 02/25/2024   Advised to follow low-carb diet Started Wegovy , considering history of CAD, obesity and family history of type II DM      Prostate cancer screening   Ordered PSA after discussing its limitations for prostate cancer screening, including false positive results leading to additional investigations.      Relevant Orders   PSA (Completed)    Other Visit Diagnoses       Vitamin D  deficiency       Relevant Orders   VITAMIN D  25 Hydroxy (Vit-D Deficiency, Fractures) (Completed)     Need for hepatitis C screening test       Relevant Orders   Hepatitis C Antibody (Completed)       Outpatient Encounter Medications as of 03/25/2024  Medication Sig   aspirin  EC 81 MG tablet Take 1 tablet (81 mg total) by mouth daily. Swallow whole.   esomeprazole (NEXIUM) 20 MG capsule Take 10 mg by mouth daily at 12 noon.   losartan  (COZAAR ) 100 MG tablet TAKE 1 TABLET BY MOUTH DAILY   metoprolol  succinate (TOPROL  XL) 25 MG 24 hr tablet Take 1 tablet (25 mg total) by mouth daily.   nitroGLYCERIN  (NITROSTAT ) 0.4 MG SL tablet DISSOLVE 1 TABLET UNDER THE TONGUE EVERY 5 MINUTES X 3 DOSES AS NEEDED FOR CHEST PAIN (IF NO RELIEF AFTER 2nd DOSE, PROCEED TO THE ER FOR AN EVALUATION OR CALL 911)   rosuvastatin  (CRESTOR ) 10 MG tablet Take 1 tablet (10 mg total) by mouth daily.   semaglutide -weight management (WEGOVY ) 0.25 MG/0.5ML SOAJ SQ injection Inject 0.25 mg into the skin every 7 (seven) days.   [DISCONTINUED] tirzepatide  (MOUNJARO ) 2.5 MG/0.5ML Pen Inject 2.5 mg into the skin once a week.   [DISCONTINUED] colchicine  0.6 MG tablet Take 1 tablet (0.6 mg total) by mouth 2 (two) times daily. (Patient not taking: Reported on 03/16/2024)   [DISCONTINUED] senna-docusate (SENOKOT-S) 8.6-50 MG tablet Take 1 tablet by mouth at bedtime as needed for mild constipation or moderate constipation. (  Patient not taking: Reported on 03/16/2024)   No facility-administered encounter medications on file as of 03/25/2024.    Follow-up: Return in about 3 months (around 06/24/2024) for Prediabetes and HLD.   Suzzane MARLA Blanch, MD

## 2024-03-25 NOTE — Assessment & Plan Note (Signed)
 Ordered PSA after discussing its limitations for prostate cancer screening, including false positive results leading to additional investigations.

## 2024-03-25 NOTE — Patient Instructions (Signed)
 Please start taking Mounjaro  as prescribed. Please contact us  after 3 weeks for uptitrating the dose.  Please continue to take medications as prescribed.  Please continue to follow low carb diet and perform moderate exercise/walking at least 150 mins/week.

## 2024-03-25 NOTE — Assessment & Plan Note (Signed)
 Recent lipid profile reviewed On Crestor  10 mg QD currently, was recently switched from atorvastatin 

## 2024-03-25 NOTE — Assessment & Plan Note (Signed)
 Discussed about colonoscopy and cologuard - benefits of each procedure discussed. Patient prefers Cologuard - ordered.

## 2024-03-25 NOTE — Assessment & Plan Note (Addendum)
 S/p DES in 2019 Followed by Cardiology On Aspirin  and statin On Metoprolol  Started Wegovy  for CAD and prediabetes Denies any recent chest pain episode

## 2024-03-26 ENCOUNTER — Encounter (INDEPENDENT_AMBULATORY_CARE_PROVIDER_SITE_OTHER): Payer: Self-pay

## 2024-03-27 ENCOUNTER — Ambulatory Visit: Payer: Self-pay | Admitting: Internal Medicine

## 2024-03-27 ENCOUNTER — Encounter (HOSPITAL_COMMUNITY)
Admission: RE | Admit: 2024-03-27 | Discharge: 2024-03-27 | Disposition: A | Discharge status: Home / Self Care | Source: Ambulatory Visit | Attending: Cardiology | Admitting: Cardiology

## 2024-03-27 ENCOUNTER — Ambulatory Visit (HOSPITAL_COMMUNITY)
Admission: RE | Admit: 2024-03-27 | Discharge: 2024-03-27 | Disposition: A | Discharge status: Home / Self Care | Source: Ambulatory Visit | Attending: Cardiology | Admitting: Cardiology

## 2024-03-27 ENCOUNTER — Telehealth: Payer: Self-pay | Admitting: Student

## 2024-03-27 DIAGNOSIS — I25118 Atherosclerotic heart disease of native coronary artery with other forms of angina pectoris: Secondary | ICD-10-CM | POA: Insufficient documentation

## 2024-03-27 LAB — NM MYOCAR MULTI W/SPECT W/WALL MOTION / EF
Estimated workload: 1
Exercise duration (min): 5 min
Exercise duration (sec): 58 s
LV dias vol: 160 mL (ref 62–150)
LV sys vol: 82 mL (ref 4.2–5.8)
MPHR: 167 {beats}/min
Nuc Stress EF: 50 %
Peak HR: 65 {beats}/min
Percent HR: 67 %
RATE: 0.9
Rest HR: 45 {beats}/min
Rest Nuclear Isotope Dose: 10.3 mCi
SDS: 0
SRS: 0
SSS: 0
ST Depression (mm): 0 mm
Stress Nuclear Isotope Dose: 30.5 mCi
TID: 1.14

## 2024-03-27 MED ORDER — REGADENOSON 0.4 MG/5ML IV SOLN
INTRAVENOUS | Status: AC
  Administered 2024-03-27: 0.4 mg via INTRAVENOUS
  Filled 2024-03-27: qty 5

## 2024-03-27 MED ORDER — TECHNETIUM TC 99M TETROFOSMIN IV KIT
30.0000 | PACK | Freq: Once | INTRAVENOUS | Status: AC | PRN
  Administered 2024-03-27: 30.5 via INTRAVENOUS

## 2024-03-27 MED ORDER — TECHNETIUM TC 99M TETROFOSMIN IV KIT
10.0000 | PACK | Freq: Once | INTRAVENOUS | Status: AC | PRN
  Administered 2024-03-27: 10.3 via INTRAVENOUS

## 2024-03-27 MED ORDER — SODIUM CHLORIDE FLUSH 0.9 % IV SOLN
INTRAVENOUS | Status: AC
  Filled 2024-03-27: qty 10

## 2024-03-27 NOTE — Telephone Encounter (Signed)
     Fairy HERO Wesch presented for a Lexiscan  nuclear stress test today.  I Laymon HERO Qua, PA-C, provided direct supervision and was present during the stress portion of the study today, which was completed without significant symptoms, immediate complications, or acute ST/T changes on ECG.  Stress imaging is pending at this time.  Preliminary ECG findings may be listed in the chart, but the stress test result will not be finalized until perfusion imaging is complete.  Laymon HERO Qua, PA-C  03/27/2024, 9:21 AM

## 2024-03-28 LAB — CMP14+EGFR
ALT: 21 IU/L (ref 0–44)
AST: 15 IU/L (ref 0–40)
Albumin: 4.7 g/dL (ref 3.8–4.9)
Alkaline Phosphatase: 117 IU/L (ref 47–123)
BUN/Creatinine Ratio: 18 (ref 9–20)
BUN: 16 mg/dL (ref 6–24)
Bilirubin Total: 0.6 mg/dL (ref 0.0–1.2)
CO2: 20 mmol/L (ref 20–29)
Calcium: 9.8 mg/dL (ref 8.7–10.2)
Chloride: 101 mmol/L (ref 96–106)
Creatinine, Ser: 0.91 mg/dL (ref 0.76–1.27)
Globulin, Total: 2.6 g/dL (ref 1.5–4.5)
Glucose: 80 mg/dL (ref 70–99)
Potassium: 4.1 mmol/L (ref 3.5–5.2)
Sodium: 140 mmol/L (ref 134–144)
Total Protein: 7.3 g/dL (ref 6.0–8.5)
eGFR: 101 mL/min/1.73 (ref >59)

## 2024-03-28 LAB — CBC WITH DIFFERENTIAL/PLATELET
Basophils Absolute: 0 x10E3/uL (ref 0.0–0.2)
Basos: 1 %
EOS (ABSOLUTE): 0.2 x10E3/uL (ref 0.0–0.4)
Eos: 2 %
Hematocrit: 44.3 % (ref 37.5–51.0)
Hemoglobin: 15.1 g/dL (ref 13.0–17.7)
Immature Grans (Abs): 0.1 x10E3/uL (ref 0.0–0.1)
Immature Granulocytes: 1 %
Lymphocytes Absolute: 2.2 x10E3/uL (ref 0.7–3.1)
Lymphs: 27 %
MCH: 30.1 pg (ref 26.6–33.0)
MCHC: 34.1 g/dL (ref 31.5–35.7)
MCV: 88 fL (ref 79–97)
Monocytes Absolute: 0.6 x10E3/uL (ref 0.1–0.9)
Monocytes: 7 %
Neutrophils Absolute: 5.1 x10E3/uL (ref 1.4–7.0)
Neutrophils: 62 %
Platelets: 197 x10E3/uL (ref 150–450)
RBC: 5.02 x10E6/uL (ref 4.14–5.80)
RDW: 13.1 % (ref 11.6–15.4)
WBC: 8.2 x10E3/uL (ref 3.4–10.8)

## 2024-03-28 LAB — PSA: Prostate Specific Ag, Serum: 0.7 ng/mL (ref 0.0–4.0)

## 2024-03-28 LAB — VITAMIN D 25 HYDROXY (VIT D DEFICIENCY, FRACTURES): Vit D, 25-Hydroxy: 27 ng/mL — ABNORMAL LOW (ref 30.0–100.0)

## 2024-03-28 LAB — TSH: TSH: 2.3 u[IU]/mL (ref 0.450–4.500)

## 2024-03-28 LAB — HEPATITIS C ANTIBODY: Hep C Virus Ab: NONREACTIVE

## 2024-03-30 ENCOUNTER — Ambulatory Visit: Payer: Self-pay | Admitting: Cardiology

## 2024-03-31 ENCOUNTER — Other Ambulatory Visit (HOSPITAL_COMMUNITY): Payer: Self-pay

## 2024-03-31 ENCOUNTER — Telehealth: Payer: Self-pay | Admitting: Pharmacy Technician

## 2024-03-31 MED ORDER — WEGOVY 0.25 MG/0.5ML ~~LOC~~ SOAJ: 0.2500 mg | SUBCUTANEOUS | 0 refills | Status: DC

## 2024-03-31 NOTE — Addendum Note (Signed)
 Addended byBETHA TOBIE DOWNS on: 03/31/2024 04:55 PM   Modules accepted: Orders

## 2024-03-31 NOTE — Telephone Encounter (Signed)
 Pharmacy Patient Advocate Encounter   Received notification from CoverMyMeds that prior authorization for Mounjaro  2.5MG /0.5ML auto-injectors is required/requested.   Insurance verification completed.   The patient is insured through [facility] .   Per test claim: Ozempic/Mounjaro  is approved exclusively as an adjunct to diet and exercise to improve glycemic  control in adults with type 2 diabetes mellitus. A review of patient's medical chart reveals no  documented diagnosis of type 2 diabetes or an A1C indicative of diabetes. Therefore, they do not  currently meet the criteria for prior authorization of this medication. If clinically appropriate, alternative  options such as Saxenda, Zepbound , or Wegovy  may be considered for this patient.

## 2024-04-01 ENCOUNTER — Telehealth: Payer: Self-pay | Admitting: Pharmacy Technician

## 2024-04-01 ENCOUNTER — Other Ambulatory Visit (HOSPITAL_COMMUNITY): Payer: Self-pay

## 2024-04-01 NOTE — Telephone Encounter (Signed)
 Pharmacy Patient Advocate Encounter  Received notification from [employer] that Prior Authorization for Wegovy  0.25MG /0.5ML auto-injectors has been APPROVED from 04/01/2024 to 04/01/2025.   PA #/Case ID/Reference #: [id number]

## 2024-04-01 NOTE — Telephone Encounter (Signed)
 Therapy changed to [facility]

## 2024-04-01 NOTE — Telephone Encounter (Signed)
 Pharmacy Patient Advocate Encounter   Received notification from Onbase that prior authorization for Wegovy  0.25mg /0.8ml auto-injectors is required/requested.   Insurance verification completed.   The patient is insured through [facility] .   Per test claim: PA required; PA submitted to above mentioned insurance via Latent Key/confirmation #/EOC [facility] Status is pending

## 2024-04-13 LAB — COLOGUARD: COLOGUARD: NEGATIVE

## 2024-04-17 ENCOUNTER — Other Ambulatory Visit: Payer: Self-pay | Admitting: Internal Medicine

## 2024-04-17 ENCOUNTER — Ambulatory Visit: Admitting: Internal Medicine

## 2024-04-17 ENCOUNTER — Ambulatory Visit (INDEPENDENT_AMBULATORY_CARE_PROVIDER_SITE_OTHER)

## 2024-04-17 ENCOUNTER — Encounter: Payer: Self-pay | Admitting: Internal Medicine

## 2024-04-17 VITALS — BP 167/103

## 2024-04-17 VITALS — BP 160/90 | HR 60 | Ht 72.0 in | Wt 221.0 lb

## 2024-04-17 DIAGNOSIS — I25119 Atherosclerotic heart disease of native coronary artery with unspecified angina pectoris: Secondary | ICD-10-CM | POA: Diagnosis not present

## 2024-04-17 DIAGNOSIS — F418 Other specified anxiety disorders: Secondary | ICD-10-CM | POA: Diagnosis not present

## 2024-04-17 DIAGNOSIS — I10 Essential (primary) hypertension: Secondary | ICD-10-CM

## 2024-04-17 MED ORDER — HYDROXYZINE HCL 10 MG PO TABS: 10.0000 mg | ORAL_TABLET | Freq: Three times a day (TID) | ORAL | 0 refills | Status: AC | PRN

## 2024-04-17 MED ORDER — AMLODIPINE BESYLATE 5 MG PO TABS: 5.0000 mg | ORAL_TABLET | Freq: Every day | ORAL | 0 refills | Status: DC

## 2024-04-17 NOTE — Assessment & Plan Note (Signed)
 BP Readings from Last 1 Encounters:  04/17/24 (!) 160/90   Uncontrolled with Losartan  100 mg QD and Metoprolol  25 mg QD Likely has some component of situational anxiety today, but proportion of elevated BP is concerning Added amlodipine 5 mg QD Advised to check BP at home regularly and contact if BP persistently >140/90 Counseled for compliance with the medications Advised DASH diet and moderate exercise/walking, at least 150 mins/week

## 2024-04-17 NOTE — Patient Instructions (Addendum)
 Please start taking Amlodipine 5 mg once daily.  Please take Hydroxyzine as needed for anxiety.  Please continue to take medications as prescribed.  Please continue to follow low carb diet and perform moderate exercise/walking at least 150 mins/week.

## 2024-04-17 NOTE — Progress Notes (Signed)
 Established Patient Office Visit  Subjective:  Patient ID: Rodney Baldwin, male    DOB: 09-02-1970  Age: 53 y.o. 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     HPI Rodney Baldwin is a 53 y.o. male with past medical history of  HTN, CAD s/p stent placement, prediabetes and HLD who presents for f/u of his HTN.  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  HTN: He currently takes losartan  100 mg QD and metoprolol  25 mg QD.  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  His BP was still elevated upon multiple measurements in the office today.  He denies any headache, dizziness, chest pain or dyspnea currently.  He has recently started taking Wegovy  for the last 1 week, has been tolerating it well.  Denies major nausea or vomiting.  Past Medical History:  Diagnosis Date   CAD (coronary artery disease)    a. 03/2018 NSTEMI/PCI: Occluded RI (DES). Otw mod, nonobs dzs; b. 07/2018 MV: No isch/infarct; c. 05/2020 MV: EF 56%, no isch/infarct; d. 05/2022 MV: EF 55-60%, no isch/infarct.   GERD (gastroesophageal reflux disease)    History of echocardiogram    a. 10/2020 Echo: EF 55-60%, no rwma, mild LVH, nl RV fxn.   Hyperlipidemia LDL goal <70    Rodney Baldwin    Laryngitis    NSTEMI (non-ST elevated myocardial infarction) [facility])    September 2019   Strep throat    Tonsillitis     Past Surgical History:  Procedure Laterality Date   CORONARY ANGIOPLASTY WITH STENT PLACEMENT     WISDOM TOOTH EXTRACTION      Family History  Problem Relation Age of Onset   CAD Mother    Diabetes Mother    CAD Father     Social History   Socioeconomic History   Marital status: Married    Spouse name: Not on file   Number of children: Not on file   Years of education: Not on file   Highest education level: Not on file  Occupational History    Not on file  Tobacco Use   Smoking status: Former    Current packs/day: 0.00    Types: Cigarettes    Quit date: 03/05/2022    Years since quitting: 2.1   Smokeless tobacco: Never  Vaping Use   Vaping status: Every Day   Substances: Nicotine   Devices: 2 mg cartridge  Substance and Sexual Activity   Alcohol use: No   Drug use: No   Sexual activity: Not on file  Other Topics Concern   Not on file  Social History Narrative   Not on file   Social Drivers of Health   Financial Resource Strain: Not on file  Food Insecurity: Not on file  Transportation Needs: Not on file  Physical Activity: Not on file  Stress: Not on file  Social Connections: Unknown (12/20/2022)   Received [facility]   Social Network    Social Network: Not on file  Intimate Partner Violence: Unknown (12/20/2022)   Received from [facility]   HITS    Physically Hurt: Not on file    Insult or Talk Down To: Not on file    Threaten Physical Harm: Not on file    Scream or Curse: Not on  file    Outpatient Medications Prior to Visit  Medication Sig Dispense Refill   aspirin  EC 81 MG tablet Take 1 tablet (81 mg total) by mouth daily. Swallow whole. 30 tablet 11   esomeprazole (NEXIUM) 20 MG capsule Take 10 mg by mouth daily at 12 noon.     losartan  (COZAAR ) 100 MG tablet TAKE 1 TABLET BY MOUTH DAILY 90 tablet 3   metoprolol  succinate (TOPROL  XL) 25 MG 24 hr tablet Take 1 tablet (25 mg total) by mouth daily. 90 tablet 3   nitroGLYCERIN  (NITROSTAT ) 0.4 MG SL tablet DISSOLVE 1 TABLET UNDER THE TONGUE EVERY 5 MINUTES X 3 DOSES AS NEEDED FOR CHEST PAIN (IF NO RELIEF AFTER 2nd DOSE, PROCEED TO THE ER FOR AN EVALUATION OR CALL 911) 100 tablet 3   rosuvastatin  (CRESTOR ) 10 MG tablet Take 1 tablet (10 mg total) by mouth daily. 90 tablet 3   semaglutide -weight management (WEGOVY ) 0.25 MG/0.5ML SOAJ SQ injection Inject 0.25 mg into the skin every 7 (seven) days. 2 mL 0   No facility-administered medications prior to  visit.    No Known Allergies  ROS Review of Systems  Constitutional:  Negative for chills and fever.  HENT:  Positive for tinnitus. Negative for congestion and sore throat.        Hearing difficulty  Eyes:  Negative for pain and discharge.  Respiratory:  Negative for cough and shortness of breath.   Cardiovascular:  Negative for chest pain and palpitations.  Gastrointestinal:  Negative for diarrhea, nausea and vomiting.  Endocrine: Negative for polydipsia and polyuria.  Genitourinary:  Negative for dysuria and hematuria.  Musculoskeletal:  Negative for neck pain and neck stiffness.  Skin:  Negative for rash.  Neurological:  Negative for dizziness, weakness, numbness and headaches.  Psychiatric/Behavioral:  Negative for agitation and behavioral problems. The patient is nervous/anxious.       Objective:    Physical Exam Vitals reviewed.  Constitutional:      General: He is not in acute distress.    Appearance: He is obese. He is not diaphoretic.  HENT:     Head: Normocephalic and atraumatic.     Nose: Nose normal.     Mouth/Throat:     Mouth: Mucous membranes are moist.  Eyes:     General: No scleral icterus.    Extraocular Movements: Extraocular movements intact.  Cardiovascular:     Rate and Rhythm: Normal rate and regular rhythm.     Heart sounds: Normal heart sounds. No murmur heard. Pulmonary:     Breath sounds: Normal breath sounds. No wheezing or rales.  Musculoskeletal:     Cervical back: Neck supple. No tenderness.     Right lower leg: No edema.     Left lower leg: No edema.  Skin:    General: Skin is warm.     Findings: No rash.     Comments: Verrucous mass over left nostril  Neurological:     General: No focal deficit present.     Mental Status: He is alert and oriented to person, place, and time.     Sensory: No sensory deficit.     Motor: No weakness.  Psychiatric:        Mood and Affect: Mood is anxious.        Behavior: Behavior normal.      BP (!) 160/90 (BP Location: Left Arm)   Pulse 60   Ht 6' (1.829 m)   Wt 221 lb (100.2 kg)  SpO2 98%   BMI 29.97 kg/m  Wt Readings from Last 3 Encounters:  04/17/24 221 lb (100.2 kg)  03/25/24 224 lb (101.6 kg)  03/16/24 222 lb 3.2 oz (100.8 kg)    Lab Results  Component Value Date   TSH 2.300 03/26/2024   Lab Results  Component Value Date   WBC 8.2 03/26/2024   HGB 15.1 03/26/2024   HCT 44.3 03/26/2024   MCV 88 03/26/2024   PLT 197 03/26/2024   Lab Results  Component Value Date   NA 140 03/26/2024   K 4.1 03/26/2024   CO2 20 03/26/2024   GLUCOSE 80 03/26/2024   BUN 16 03/26/2024   CREATININE 0.91 03/26/2024   BILITOT 0.6 03/26/2024   ALKPHOS 117 03/26/2024   AST 15 03/26/2024   ALT 21 03/26/2024   PROT 7.3 03/26/2024   ALBUMIN 4.7 03/26/2024   CALCIUM  9.8 03/26/2024   ANIONGAP 8 05/11/2022   EGFR 101 03/26/2024   Lab Results  Component Value Date   CHOL 133 02/25/2024   Lab Results  Component Value Date   HDL 25 (A) 02/25/2024   Lab Results  Component Value Date   LDLCALC 82 02/25/2024   Lab Results  Component Value Date   TRIG 247 (H) 05/29/2018   No results found for: CHOLHDL Lab Results  Component Value Date   HGBA1C 6.1 02/25/2024      Assessment & Plan:   Problem List Items Addressed This Visit       Cardiovascular and Mediastinum   CAD (coronary artery disease)   S/p DES in 2019 Followed by Cardiology On Aspirin  and statin On Metoprolol  Recently started Wegovy  for CAD and prediabetes Denies any recent chest pain episode      Relevant Medications   amLODipine (NORVASC) 5 MG tablet   HTN (Rodney Baldwin) - Primary   BP Readings from Last 1 Encounters:  04/17/24 (!) 160/90   Uncontrolled with Losartan  100 mg QD and Metoprolol  25 mg QD Likely has some component of situational anxiety today, but proportion of elevated BP is concerning Added amlodipine 5 mg QD Advised to check BP at home regularly and contact if BP  persistently >140/90 Counseled for compliance with the medications Advised DASH diet and moderate exercise/walking, at least 150 mins/week      Relevant Medications   amLODipine (NORVASC) 5 MG tablet     Other   Situational anxiety   Likely has situational anxiety for his DOT physical Avoided propranolol as he is already on metoprolol  for history of CAD Hydroxyzine as needed for anxiety      Relevant Medications   hydrOXYzine (ATARAX) 10 MG tablet    Meds ordered this encounter  Medications   amLODipine (NORVASC) 5 MG tablet    Sig: Take 1 tablet (5 mg total) by mouth daily.    Dispense:  30 tablet    Refill:  0   hydrOXYzine (ATARAX) 10 MG tablet    Sig: Take 1 tablet (10 mg total) by mouth 3 (three) times daily as needed.    Dispense:  30 tablet    Refill:  0    Follow-up: Return if symptoms worsen or fail to improve.    Suzzane MARLA Blanch, MD

## 2024-04-17 NOTE — Assessment & Plan Note (Signed)
 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

## 2024-04-17 NOTE — Progress Notes (Signed)
 Patient is in office today for a nurse visit for Blood Pressure Check. Patient blood pressure was 167/103, Patient No chest pain, No shortness of breath, No dyspnea on exertion

## 2024-04-17 NOTE — Assessment & Plan Note (Addendum)
 S/p DES in 2019 Followed by Cardiology On Aspirin  and statin On Metoprolol  Recently started Wegovy  for CAD and prediabetes Denies any recent chest pain episode

## 2024-05-01 ENCOUNTER — Encounter (INDEPENDENT_AMBULATORY_CARE_PROVIDER_SITE_OTHER): Payer: Self-pay

## 2024-05-04 ENCOUNTER — Other Ambulatory Visit: Payer: Self-pay | Admitting: Internal Medicine

## 2024-05-04 DIAGNOSIS — I25119 Atherosclerotic heart disease of native coronary artery with unspecified angina pectoris: Secondary | ICD-10-CM

## 2024-05-04 MED ORDER — WEGOVY 0.5 MG/0.5ML ~~LOC~~ SOAJ: 0.5000 mg | SUBCUTANEOUS | 0 refills | Status: DC

## 2024-05-07 ENCOUNTER — Other Ambulatory Visit: Payer: Self-pay | Admitting: Internal Medicine

## 2024-05-07 DIAGNOSIS — I10 Essential (primary) hypertension: Secondary | ICD-10-CM

## 2024-05-07 MED ORDER — AMLODIPINE BESYLATE 5 MG PO TABS: 5.0000 mg | ORAL_TABLET | Freq: Every day | ORAL | 3 refills | Status: AC

## 2024-05-12 ENCOUNTER — Other Ambulatory Visit: Payer: Self-pay | Admitting: Cardiology

## 2024-05-14 ENCOUNTER — Other Ambulatory Visit: Payer: Self-pay

## 2024-05-14 MED ORDER — METOPROLOL SUCCINATE ER 25 MG PO TB24
25.0000 mg | ORAL_TABLET | Freq: Every day | ORAL | 3 refills | Status: AC
Start: 2024-05-14 — End: ?

## 2024-05-14 MED ORDER — ROSUVASTATIN CALCIUM 10 MG PO TABS: 10.0000 mg | ORAL_TABLET | Freq: Every day | ORAL | 3 refills | Status: DC

## 2024-05-14 MED ORDER — ROSUVASTATIN CALCIUM 10 MG PO TABS: 10.0000 mg | ORAL_TABLET | Freq: Every day | ORAL | 3 refills | Status: AC

## 2024-05-26 ENCOUNTER — Other Ambulatory Visit: Payer: Self-pay | Admitting: Internal Medicine

## 2024-05-26 DIAGNOSIS — I25119 Atherosclerotic heart disease of native coronary artery with unspecified angina pectoris: Secondary | ICD-10-CM

## 2024-05-26 DIAGNOSIS — R7303 Prediabetes: Secondary | ICD-10-CM

## 2024-05-26 MED ORDER — WEGOVY 1 MG/0.5ML ~~LOC~~ SOAJ: 1.0000 mg | SUBCUTANEOUS | 0 refills | Status: DC

## 2024-05-30 LAB — BAYER DCA HB A1C WAIVED: HB A1C (BAYER DCA - WAIVED): 5.4 % (ref 4.8–5.6)

## 2024-06-16 ENCOUNTER — Other Ambulatory Visit: Payer: Self-pay | Admitting: Nurse Practitioner

## 2024-06-17 ENCOUNTER — Other Ambulatory Visit: Payer: Self-pay | Admitting: *Deleted

## 2024-06-17 MED ORDER — LOSARTAN POTASSIUM 100 MG PO TABS: 100.0000 mg | ORAL_TABLET | Freq: Every day | ORAL | 3 refills | Status: AC

## 2024-06-24 ENCOUNTER — Other Ambulatory Visit: Payer: Self-pay | Admitting: Internal Medicine

## 2024-06-24 DIAGNOSIS — I25119 Atherosclerotic heart disease of native coronary artery with unspecified angina pectoris: Secondary | ICD-10-CM

## 2024-06-24 MED ORDER — WEGOVY 1 MG/0.5ML ~~LOC~~ SOAJ: 1.0000 mg | SUBCUTANEOUS | 5 refills | Status: AC

## 2024-07-14 ENCOUNTER — Encounter: Payer: Self-pay | Admitting: Internal Medicine

## 2024-07-14 ENCOUNTER — Ambulatory Visit: Admitting: Internal Medicine

## 2024-07-14 VITALS — BP 115/73 | HR 69 | Ht 72.0 in | Wt 212.4 lb

## 2024-07-14 DIAGNOSIS — I25119 Atherosclerotic heart disease of native coronary artery with unspecified angina pectoris: Secondary | ICD-10-CM

## 2024-07-14 DIAGNOSIS — E782 Mixed hyperlipidemia: Secondary | ICD-10-CM

## 2024-07-14 DIAGNOSIS — I10 Essential (primary) hypertension: Secondary | ICD-10-CM | POA: Diagnosis not present

## 2024-07-14 DIAGNOSIS — E349 Endocrine disorder, unspecified: Secondary | ICD-10-CM | POA: Insufficient documentation

## 2024-07-14 DIAGNOSIS — E559 Vitamin D deficiency, unspecified: Secondary | ICD-10-CM

## 2024-07-14 DIAGNOSIS — R7303 Prediabetes: Secondary | ICD-10-CM | POA: Diagnosis not present

## 2024-07-14 DIAGNOSIS — L858 Other specified epidermal thickening: Secondary | ICD-10-CM | POA: Diagnosis not present

## 2024-07-14 DIAGNOSIS — Z125 Encounter for screening for malignant neoplasm of prostate: Secondary | ICD-10-CM

## 2024-07-14 DIAGNOSIS — Z0001 Encounter for general adult medical examination with abnormal findings: Secondary | ICD-10-CM | POA: Insufficient documentation

## 2024-07-14 NOTE — Assessment & Plan Note (Signed)
 S/p DES in 2019 Followed by Cardiology On Aspirin  and statin On Metoprolol  Recently started Wegovy  for CAD and prediabetes Denies any recent chest pain episode

## 2024-07-14 NOTE — Assessment & Plan Note (Signed)
 BP Readings from Last 1 Encounters:  07/14/24 115/73   Well-controlled with Losartan  100 mg QD, amlodipine  5 mg QD and Metoprolol  25 mg QD Counseled for compliance with the medications Advised DASH diet and moderate exercise/walking, at least 150 mins/week

## 2024-07-14 NOTE — Patient Instructions (Addendum)
 Please continue to take medications as prescribed.  Please continue to follow low carb diet and perform moderate exercise/walking at least 150 mins/week.  Please get fasting blood tests done after 6 weeks.

## 2024-07-14 NOTE — Assessment & Plan Note (Signed)
 Lab Results  Component Value Date   HGBA1C 5.4 05/28/2024   Advised to follow low-carb diet Continue Wegovy , considering history of CAD, obesity and family history of type II DM

## 2024-07-14 NOTE — Assessment & Plan Note (Signed)
 Ordered PSA after discussing its limitations for prostate cancer screening, including false positive results leading to additional investigations.

## 2024-07-14 NOTE — Assessment & Plan Note (Signed)
 Mass on nose is concerning for keratoacanthoma Referred to dermatology in [place] now, as per patient request

## 2024-07-14 NOTE — Assessment & Plan Note (Signed)
 Recent lipid profile reviewed On Crestor  10 mg QD currently, was recently switched from atorvastatin 

## 2024-07-14 NOTE — Assessment & Plan Note (Signed)
 Reports low testosterone level in the past Reports mild, chronic fatigue Check total and free testosterone level

## 2024-07-14 NOTE — Progress Notes (Signed)
 "  Established Patient Office Visit  Subjective:  Patient ID: Rodney Baldwin, male    DOB: 23-Dec-1970  Age: 54 y.o. MRN: [id number]  CC:  Chief Complaint  Patient presents with   Follow-up    3 month f/u    Annual Exam    HPI Rodney Baldwin is a 54 y.o. male with past medical history of  HTN, CAD s/p stent placement, prediabetes and HLD who presents for f/u of his chronic medical conditions.  HTN: His BP is WNL today.  He currently takes losartan  100 mg QD, amlodipine  5 mg QD and metoprolol  25 mg QD. He denies any headache, dizziness, chest pain or dyspnea currently.  He takes rosuvastatin  for HLD.  CAD: He takes aspirin  and statin currently, followed by cardiology.  He has started taking Wegovy  1 mg for the last 1 month, has been tolerating it well.  Denies major nausea or vomiting. He has lost about 9 lbs since the last visit.  Past Medical History:  Diagnosis Date   CAD (coronary artery disease)    a. 03/2018 NSTEMI/PCI: [place] RI (DES). Otw mod, nonobs dzs; b. 07/2018 MV: No isch/infarct; c. 05/2020 MV: EF 56%, no isch/infarct; d. 05/2022 MV: EF 55-60%, no isch/infarct.   GERD (gastroesophageal reflux disease)    History of echocardiogram    a. 10/2020 Echo: EF 55-60%, no rwma, mild LVH, nl RV fxn.   Hyperlipidemia LDL goal <70    Hypertension    Laryngitis    NSTEMI (non-ST elevated myocardial infarction) [facility])    September 2019   Strep throat    Tonsillitis     Past Surgical History:  Procedure Laterality Date   CORONARY ANGIOPLASTY WITH STENT PLACEMENT     WISDOM TOOTH EXTRACTION      Family History  Problem Relation Age of Onset   CAD Mother    Diabetes Mother    CAD Father     Social History   Socioeconomic History   Marital status: Married    Spouse name: Not on file   Number of children: Not on file   Years of education: Not on file   Highest education level: Not on file  Occupational History   Not on file  Tobacco Use   Smoking status: Former     Current packs/day: 0.00    Average packs/day: 1.0 packs/day    Types: Cigarettes    Quit date: 03/05/2022    Years since quitting: 2.3   Smokeless tobacco: Never  Vaping Use   Vaping status: Every Day   Substances: Nicotine   Devices: 2 mg cartridge  Substance and Sexual Activity   Alcohol use: No   Drug use: No   Sexual activity: Not on file  Other Topics Concern   Not on file  Social History Narrative   Not on file   Social Drivers of Health   Tobacco Use: Medium Risk (07/14/2024)   Patient History    Smoking Tobacco Use: Former    Smokeless Tobacco Use: Never    Passive Exposure: Not on [place] Strain: Not on file  Food Insecurity: Not on file  Transportation Needs: Not on file  Physical Activity: Not on file  Stress: Not on file  Social Connections: Not on file  Intimate Partner Violence: Not on file  Depression (PHQ2-9): Low Risk (04/17/2024)   Depression (PHQ2-9)    PHQ-2 Score: 0  Alcohol Screen: Not on file  Housing: Not on file  Utilities: Not  on file  Health Literacy: Not on file    Outpatient Medications Prior to Visit  Medication Sig Dispense Refill   amLODipine  (NORVASC ) 5 MG tablet Take 1 tablet (5 mg total) by mouth daily. 90 tablet 3   aspirin  EC 81 MG tablet Take 1 tablet (81 mg total) by mouth daily. Swallow whole. 30 tablet 11   esomeprazole (NEXIUM) 20 MG capsule Take 10 mg by mouth daily at 12 noon.     hydrOXYzine  (ATARAX ) 10 MG tablet Take 1 tablet (10 mg total) by mouth 3 (three) times daily as needed. 30 tablet 0   losartan  (COZAAR ) 100 MG tablet Take 1 tablet (100 mg total) by mouth daily. 90 tablet 3   metoprolol  succinate (TOPROL  XL) 25 MG 24 hr tablet Take 1 tablet (25 mg total) by mouth daily. 90 tablet 3   nitroGLYCERIN  (NITROSTAT ) 0.4 MG SL tablet DISSOLVE 1 TABLET UNDER THE TONGUE EVERY 5 MINUTES X 3 DOSES AS NEEDED FOR CHEST PAIN (IF NO RELIEF AFTER 2nd DOSE, PROCEED TO THE ER FOR AN EVALUATION OR CALL 911) 100 tablet 3    rosuvastatin  (CRESTOR ) 10 MG tablet Take 1 tablet (10 mg total) by mouth daily. 90 tablet 3   semaglutide -weight management (WEGOVY ) 1 MG/0.5ML SOAJ SQ injection Inject 1 mg into the skin every 7 (seven) days. 2 mL 5   No facility-administered medications prior to visit.    No Known Allergies  ROS Review of Systems  Constitutional:  Negative for chills and fever.  HENT:  Positive for tinnitus. Negative for congestion and sore throat.        Hearing difficulty  Eyes:  Negative for pain and discharge.  Respiratory:  Negative for cough and shortness of breath.   Cardiovascular:  Negative for chest pain and palpitations.  Gastrointestinal:  Negative for diarrhea, nausea and vomiting.  Endocrine: Negative for polydipsia and polyuria.  Genitourinary:  Negative for dysuria and hematuria.  Musculoskeletal:  Negative for neck pain and neck stiffness.  Skin:  Negative for rash.  Neurological:  Negative for dizziness, weakness, numbness and headaches.  Psychiatric/Behavioral:  Negative for agitation and behavioral problems.       Objective:    Physical Exam Vitals reviewed.  Constitutional:      General: He is not in acute distress.    Appearance: He is obese. He is not diaphoretic.  HENT:     Head: Normocephalic and atraumatic.     Nose: Nose normal.     Mouth/Throat:     Mouth: Mucous membranes are moist.  Eyes:     General: No scleral icterus.    Extraocular Movements: Extraocular movements intact.  Cardiovascular:     Rate and Rhythm: Normal rate and regular rhythm.     Heart sounds: Normal heart sounds. No murmur heard. Pulmonary:     Breath sounds: Normal breath sounds. No wheezing or rales.  Abdominal:     Palpations: Abdomen is soft.     Tenderness: There is no abdominal tenderness.  Musculoskeletal:     Cervical back: Neck supple. No tenderness.     Right lower leg: No edema.     Left lower leg: No edema.  Skin:    General: Skin is warm.     Findings: No rash.      Comments: Verrucous mass over left nostril  Neurological:     General: No focal deficit present.     Mental Status: He is alert and oriented to person, place, and time.  Cranial Nerves: No cranial nerve deficit.     Sensory: No sensory deficit.     Motor: No weakness.  Psychiatric:        Mood and Affect: Mood normal.        Behavior: Behavior normal.     BP 115/73   Pulse 69   Ht 6' (1.829 m)   Wt 212 lb 6.4 oz (96.3 kg)   SpO2 98%   BMI 28.81 kg/m  Wt Readings from Last 3 Encounters:  07/14/24 212 lb 6.4 oz (96.3 kg)  04/17/24 221 lb (100.2 kg)  03/25/24 224 lb (101.6 kg)    Lab Results  Component Value Date   TSH 2.300 03/26/2024   Lab Results  Component Value Date   WBC 8.2 03/26/2024   HGB 15.1 03/26/2024   HCT 44.3 03/26/2024   MCV 88 03/26/2024   PLT 197 03/26/2024   Lab Results  Component Value Date   NA 140 03/26/2024   K 4.1 03/26/2024   CO2 20 03/26/2024   GLUCOSE 80 03/26/2024   BUN 16 03/26/2024   CREATININE 0.91 03/26/2024   BILITOT 0.6 03/26/2024   ALKPHOS 117 03/26/2024   AST 15 03/26/2024   ALT 21 03/26/2024   PROT 7.3 03/26/2024   ALBUMIN 4.7 03/26/2024   CALCIUM  9.8 03/26/2024   ANIONGAP 8 05/11/2022   EGFR 101 03/26/2024   Lab Results  Component Value Date   CHOL 133 02/25/2024   Lab Results  Component Value Date   HDL 25 (A) 02/25/2024   Lab Results  Component Value Date   LDLCALC 82 02/25/2024   Lab Results  Component Value Date   TRIG 247 (H) 05/29/2018   No results found for: CHOLHDL Lab Results  Component Value Date   HGBA1C 5.4 05/28/2024      Assessment & Plan:   Problem List Items Addressed This Visit       Cardiovascular and Mediastinum   CAD (coronary artery disease)   S/p DES in 2019 Followed by Cardiology On Aspirin  and statin On Metoprolol  Recently started Wegovy  for CAD and prediabetes Denies any recent chest pain episode      Relevant Orders   TSH   CMP14+EGFR   CBC with  Differential/Platelet   HTN (hypertension)   BP Readings from Last 1 Encounters:  07/14/24 115/73   Well-controlled with Losartan  100 mg QD, amlodipine  5 mg QD and Metoprolol  25 mg QD Counseled for compliance with the medications Advised DASH diet and moderate exercise/walking, at least 150 mins/week      Relevant Orders   CMP14+EGFR   CBC with Differential/Platelet     Musculoskeletal and Integument   Keratoacanthoma of nose   Mass on nose is concerning for keratoacanthoma Referred to dermatology in [place] now, as per patient request      Relevant Orders   Ambulatory referral to Dermatology     Other   Mixed hyperlipidemia   Recent lipid profile reviewed On Crestor  10 mg QD currently, was recently switched from atorvastatin       Relevant Orders   Lipid panel   Prediabetes   Lab Results  Component Value Date   HGBA1C 5.4 05/28/2024   Advised to follow low-carb diet Continue Wegovy , considering history of CAD, obesity and family history of type II DM      Relevant Orders   Hemoglobin A1c   CMP14+EGFR   Prostate cancer screening   Ordered PSA after discussing its limitations for prostate cancer screening, including false  positive results leading to additional investigations.      Relevant Orders   PSA   Testosterone deficiency   Reports low testosterone level in the past Reports mild, chronic fatigue Check total and free testosterone level      Relevant Orders   Testosterone,Free and Total   Encounter for general adult medical examination with abnormal findings - Primary   Physical exam as documented. Fasting blood tests ordered today. Advised to get Shingrix and pneumococcal vaccines, but he denies.      Other Visit Diagnoses       Vitamin D  deficiency       Relevant Orders   VITAMIN D  25 Hydroxy (Vit-D Deficiency, Fractures)       No orders of the defined types were placed in this encounter.   Follow-up: Return in about 6 months (around  01/11/2025) for HTN and CAD.    Suzzane MARLA Blanch, MD "

## 2024-07-14 NOTE — Assessment & Plan Note (Signed)
 Physical exam as documented. Fasting blood tests ordered today. Advised to get Shingrix and pneumococcal vaccines, but he denies.

## 2024-08-14 ENCOUNTER — Ambulatory Visit: Payer: Self-pay | Admitting: Internal Medicine

## 2024-08-14 LAB — LIPID PANEL
Chol/HDL Ratio: 4.4 ratio (ref 0.0–5.0)
Cholesterol, Total: 120 mg/dL (ref 100–199)
HDL: 27 mg/dL — ABNORMAL LOW
LDL Chol Calc (NIH): 60 mg/dL (ref 0–99)
Triglycerides: 201 mg/dL — ABNORMAL HIGH (ref 0–149)
VLDL Cholesterol Cal: 33 mg/dL (ref 5–40)

## 2024-08-14 LAB — CMP14+EGFR
ALT: 17 [IU]/L (ref 0–44)
AST: 14 [IU]/L (ref 0–40)
Albumin: 4.2 g/dL (ref 3.8–4.9)
Alkaline Phosphatase: 101 [IU]/L (ref 47–123)
BUN/Creatinine Ratio: 17 (ref 9–20)
BUN: 15 mg/dL (ref 6–24)
Bilirubin Total: 0.4 mg/dL (ref 0.0–1.2)
CO2: 20 mmol/L (ref 20–29)
Calcium: 9.6 mg/dL (ref 8.7–10.2)
Chloride: 104 mmol/L (ref 96–106)
Creatinine, Ser: 0.9 mg/dL (ref 0.76–1.27)
Globulin, Total: 2.5 g/dL (ref 1.5–4.5)
Glucose: 99 mg/dL (ref 70–99)
Potassium: 4.5 mmol/L (ref 3.5–5.2)
Sodium: 138 mmol/L (ref 134–144)
Total Protein: 6.7 g/dL (ref 6.0–8.5)
eGFR: 102 mL/min/{1.73_m2}

## 2024-08-14 LAB — CBC WITH DIFFERENTIAL/PLATELET
Basophils Absolute: 0 10*3/uL (ref 0.0–0.2)
Basos: 1 %
EOS (ABSOLUTE): 0.3 10*3/uL (ref 0.0–0.4)
Eos: 4 %
Hematocrit: 40.2 % (ref 37.5–51.0)
Hemoglobin: 13.8 g/dL (ref 13.0–17.7)
Immature Grans (Abs): 0 10*3/uL (ref 0.0–0.1)
Immature Granulocytes: 0 %
Lymphocytes Absolute: 1.9 10*3/uL (ref 0.7–3.1)
Lymphs: 26 %
MCH: 30.5 pg (ref 26.6–33.0)
MCHC: 34.3 g/dL (ref 31.5–35.7)
MCV: 89 fL (ref 79–97)
Monocytes Absolute: 0.5 10*3/uL (ref 0.1–0.9)
Monocytes: 7 %
Neutrophils Absolute: 4.4 10*3/uL (ref 1.4–7.0)
Neutrophils: 62 %
Platelets: 177 10*3/uL (ref 150–450)
RBC: 4.53 x10E6/uL (ref 4.14–5.80)
RDW: 12.7 % (ref 11.6–15.4)
WBC: 7.1 10*3/uL (ref 3.4–10.8)

## 2024-08-14 LAB — PSA: Prostate Specific Ag, Serum: 0.7 ng/mL (ref 0.0–4.0)

## 2024-08-14 LAB — TSH: TSH: 2.26 u[IU]/mL (ref 0.450–4.500)

## 2024-08-14 LAB — VITAMIN D 25 HYDROXY (VIT D DEFICIENCY, FRACTURES): Vit D, 25-Hydroxy: 30.4 ng/mL (ref 30.0–100.0)

## 2024-08-14 LAB — TESTOSTERONE,FREE AND TOTAL: Testosterone: 418 ng/dL (ref 264–916)

## 2024-08-14 LAB — HEMOGLOBIN A1C
Est. average glucose Bld gHb Est-mCnc: 105 mg/dL
Hgb A1c MFr Bld: 5.3 % (ref 4.8–5.6)

## 2025-01-11 ENCOUNTER — Ambulatory Visit: Payer: Self-pay | Admitting: Internal Medicine
